# Patient Record
Sex: Male | Born: 1955
Health system: Southern US, Community
[De-identification: ages and names within clinical notes are randomized; demographics above are authoritative.]

## PROBLEM LIST (undated history)

## (undated) DIAGNOSIS — Z720 Tobacco use: Secondary | ICD-10-CM

## (undated) DIAGNOSIS — I10 Essential (primary) hypertension: Secondary | ICD-10-CM

---

## 2004-07-10 ENCOUNTER — Emergency Department (HOSPITAL_COMMUNITY): Admission: EM | Admit: 2004-07-10 | Discharge: 2004-07-11 | Payer: Self-pay | Admitting: Emergency Medicine

## 2006-03-12 ENCOUNTER — Emergency Department (HOSPITAL_COMMUNITY): Admission: EM | Admit: 2006-03-12 | Discharge: 2006-03-12 | Payer: Self-pay | Admitting: Emergency Medicine

## 2006-03-19 ENCOUNTER — Ambulatory Visit: Payer: Self-pay | Admitting: Family Medicine

## 2006-04-24 ENCOUNTER — Ambulatory Visit: Payer: Self-pay | Admitting: Family Medicine

## 2007-07-04 ENCOUNTER — Emergency Department (HOSPITAL_COMMUNITY): Admission: EM | Admit: 2007-07-04 | Discharge: 2007-07-04 | Payer: Self-pay | Admitting: Emergency Medicine

## 2011-07-05 LAB — DIFFERENTIAL
Basophils Absolute: 0
Basophils Relative: 1
Eosinophils Absolute: 0.1
Eosinophils Relative: 1
Lymphocytes Relative: 44
Lymphs Abs: 3.4 — ABNORMAL HIGH
Monocytes Absolute: 0.5
Monocytes Relative: 6
Neutro Abs: 3.6
Neutrophils Relative %: 48

## 2011-07-05 LAB — POCT CARDIAC MARKERS
CKMB, poc: <1 — ABNORMAL LOW
Myoglobin, poc: 55.2
Operator id: 4295
Troponin i, poc: <0.05

## 2011-07-05 LAB — CBC
HCT: 47.2
Hemoglobin: 16.7
MCHC: 35.3
MCV: 88.3
Platelets: 317
RBC: 5.34
RDW: 14
WBC: 7.6

## 2011-07-05 LAB — BASIC METABOLIC PANEL
BUN: 8
CO2: 24
Calcium: 9.7
Chloride: 106
Creatinine, Ser: 0.75
GFR calc Af Amer: >60
GFR calc non Af Amer: >60
Glucose, Bld: 107 — ABNORMAL HIGH
Potassium: 4.1
Sodium: 137

## 2011-09-10 ENCOUNTER — Emergency Department (HOSPITAL_COMMUNITY): Payer: Worker's Compensation

## 2011-09-10 ENCOUNTER — Encounter (HOSPITAL_COMMUNITY): Admission: EM | Disposition: A | Discharge status: Skilled Nursing Facility | Payer: Self-pay | Source: Ambulatory Visit

## 2011-09-10 ENCOUNTER — Inpatient Hospital Stay (HOSPITAL_COMMUNITY)
Admission: EM | Admit: 2011-09-10 | Discharge: 2011-09-27 | DRG: 493 | Disposition: A | Discharge status: Skilled Nursing Facility | Payer: Worker's Compensation | Source: Ambulatory Visit | Attending: General Surgery | Admitting: General Surgery

## 2011-09-10 ENCOUNTER — Other Ambulatory Visit: Payer: Self-pay

## 2011-09-10 ENCOUNTER — Emergency Department (HOSPITAL_COMMUNITY): Payer: Worker's Compensation | Admitting: Anesthesiology

## 2011-09-10 ENCOUNTER — Encounter: Payer: Self-pay | Admitting: Emergency Medicine

## 2011-09-10 ENCOUNTER — Encounter (HOSPITAL_COMMUNITY): Payer: Self-pay | Admitting: Anesthesiology

## 2011-09-10 DIAGNOSIS — IMO0001 Reserved for inherently not codable concepts without codable children: Secondary | ICD-10-CM | POA: Diagnosis present

## 2011-09-10 DIAGNOSIS — M6200 Separation of muscle (nontraumatic), unspecified site: Secondary | ICD-10-CM | POA: Diagnosis present

## 2011-09-10 DIAGNOSIS — S71009A Unspecified open wound, unspecified hip, initial encounter: Secondary | ICD-10-CM | POA: Diagnosis present

## 2011-09-10 DIAGNOSIS — L89109 Pressure ulcer of unspecified part of back, unspecified stage: Secondary | ICD-10-CM | POA: Diagnosis not present

## 2011-09-10 DIAGNOSIS — S81809A Unspecified open wound, unspecified lower leg, initial encounter: Secondary | ICD-10-CM

## 2011-09-10 DIAGNOSIS — S82899A Other fracture of unspecified lower leg, initial encounter for closed fracture: Principal | ICD-10-CM | POA: Diagnosis present

## 2011-09-10 DIAGNOSIS — Z79899 Other long term (current) drug therapy: Secondary | ICD-10-CM

## 2011-09-10 DIAGNOSIS — IMO0002 Reserved for concepts with insufficient information to code with codable children: Secondary | ICD-10-CM | POA: Diagnosis present

## 2011-09-10 DIAGNOSIS — S71109A Unspecified open wound, unspecified thigh, initial encounter: Secondary | ICD-10-CM | POA: Diagnosis present

## 2011-09-10 DIAGNOSIS — A0472 Enterocolitis due to Clostridium difficile, not specified as recurrent: Secondary | ICD-10-CM | POA: Diagnosis not present

## 2011-09-10 DIAGNOSIS — S92309A Fracture of unspecified metatarsal bone(s), unspecified foot, initial encounter for closed fracture: Secondary | ICD-10-CM | POA: Diagnosis present

## 2011-09-10 DIAGNOSIS — S43006A Unspecified dislocation of unspecified shoulder joint, initial encounter: Secondary | ICD-10-CM

## 2011-09-10 DIAGNOSIS — S32309A Unspecified fracture of unspecified ilium, initial encounter for closed fracture: Secondary | ICD-10-CM | POA: Diagnosis present

## 2011-09-10 DIAGNOSIS — S91009A Unspecified open wound, unspecified ankle, initial encounter: Secondary | ICD-10-CM

## 2011-09-10 DIAGNOSIS — S42253A Displaced fracture of greater tuberosity of unspecified humerus, initial encounter for closed fracture: Secondary | ICD-10-CM | POA: Diagnosis present

## 2011-09-10 DIAGNOSIS — S82891A Other fracture of right lower leg, initial encounter for closed fracture: Secondary | ICD-10-CM

## 2011-09-10 DIAGNOSIS — K560 Paralytic ileus: Secondary | ICD-10-CM | POA: Diagnosis not present

## 2011-09-10 DIAGNOSIS — N4889 Other specified disorders of penis: Secondary | ICD-10-CM | POA: Diagnosis not present

## 2011-09-10 DIAGNOSIS — Z7901 Long term (current) use of anticoagulants: Secondary | ICD-10-CM

## 2011-09-10 DIAGNOSIS — I959 Hypotension, unspecified: Secondary | ICD-10-CM | POA: Diagnosis present

## 2011-09-10 DIAGNOSIS — S9304XA Dislocation of right ankle joint, initial encounter: Secondary | ICD-10-CM

## 2011-09-10 DIAGNOSIS — Z7982 Long term (current) use of aspirin: Secondary | ICD-10-CM

## 2011-09-10 DIAGNOSIS — D62 Acute posthemorrhagic anemia: Secondary | ICD-10-CM | POA: Diagnosis not present

## 2011-09-10 DIAGNOSIS — S43004A Unspecified dislocation of right shoulder joint, initial encounter: Secondary | ICD-10-CM

## 2011-09-10 DIAGNOSIS — S81009A Unspecified open wound, unspecified knee, initial encounter: Secondary | ICD-10-CM

## 2011-09-10 DIAGNOSIS — F17200 Nicotine dependence, unspecified, uncomplicated: Secondary | ICD-10-CM | POA: Diagnosis present

## 2011-09-10 DIAGNOSIS — I10 Essential (primary) hypertension: Secondary | ICD-10-CM | POA: Diagnosis present

## 2011-09-10 DIAGNOSIS — Y9229 Other specified public building as the place of occurrence of the external cause: Secondary | ICD-10-CM

## 2011-09-10 DIAGNOSIS — S329XXA Fracture of unspecified parts of lumbosacral spine and pelvis, initial encounter for closed fracture: Secondary | ICD-10-CM

## 2011-09-10 DIAGNOSIS — L8992 Pressure ulcer of unspecified site, stage 2: Secondary | ICD-10-CM | POA: Diagnosis not present

## 2011-09-10 DIAGNOSIS — S81819A Laceration without foreign body, unspecified lower leg, initial encounter: Secondary | ICD-10-CM

## 2011-09-10 DIAGNOSIS — R000 Tachycardia, unspecified: Secondary | ICD-10-CM | POA: Diagnosis present

## 2011-09-10 DIAGNOSIS — S3210XA Unspecified fracture of sacrum, initial encounter for closed fracture: Secondary | ICD-10-CM | POA: Diagnosis present

## 2011-09-10 DIAGNOSIS — S82009B Unspecified fracture of unspecified patella, initial encounter for open fracture type I or II: Secondary | ICD-10-CM | POA: Diagnosis present

## 2011-09-10 DIAGNOSIS — S42301A Unspecified fracture of shaft of humerus, right arm, initial encounter for closed fracture: Secondary | ICD-10-CM

## 2011-09-10 DIAGNOSIS — S32509A Unspecified fracture of unspecified pubis, initial encounter for closed fracture: Secondary | ICD-10-CM | POA: Diagnosis present

## 2011-09-10 HISTORY — DX: Essential (primary) hypertension: I10

## 2011-09-10 HISTORY — DX: Tobacco use: Z72.0

## 2011-09-10 HISTORY — PX: EXTERNAL FIXATION PELVIS: SHX1551

## 2011-09-10 HISTORY — PX: IRRIGATION AND DEBRIDEMENT KNEE: SHX5185

## 2011-09-10 LAB — URINALYSIS, MICROSCOPIC ONLY
Glucose, UA: NEGATIVE mg/dL
Ketones, ur: 15 mg/dL — AB
Nitrite: POSITIVE — AB
Protein, ur: 100 mg/dL — AB
Specific Gravity, Urine: 1.025 (ref 1.005–1.030)
Urobilinogen, UA: 1 mg/dL (ref 0.0–1.0)
pH: 5.5 (ref 5.0–8.0)

## 2011-09-10 LAB — COMPREHENSIVE METABOLIC PANEL
ALT: 20 U/L (ref 0–53)
AST: 38 U/L — ABNORMAL HIGH (ref 0–37)
Albumin: 2.7 g/dL — ABNORMAL LOW (ref 3.5–5.2)
Alkaline Phosphatase: 53 U/L (ref 39–117)
BUN: 19 mg/dL (ref 6–23)
CO2: 18 mEq/L — ABNORMAL LOW (ref 19–32)
Calcium: 6.4 mg/dL — CL (ref 8.4–10.5)
Chloride: 107 mEq/L (ref 96–112)
Creatinine, Ser: 1.16 mg/dL (ref 0.50–1.35)
GFR calc Af Amer: 80 mL/min — ABNORMAL LOW (ref >90)
GFR calc non Af Amer: 69 mL/min — ABNORMAL LOW (ref >90)
Glucose, Bld: 167 mg/dL — ABNORMAL HIGH (ref 70–99)
Potassium: 3.4 mEq/L — ABNORMAL LOW (ref 3.5–5.1)
Sodium: 137 mEq/L (ref 135–145)
Total Bilirubin: 0.3 mg/dL (ref 0.3–1.2)
Total Protein: 5.3 g/dL — ABNORMAL LOW (ref 6.0–8.3)

## 2011-09-10 LAB — CBC
HCT: 40.8 % (ref 39.0–52.0)
Hemoglobin: 14.3 g/dL (ref 13.0–17.0)
MCH: 31 pg (ref 26.0–34.0)
MCHC: 35 g/dL (ref 30.0–36.0)
MCHC: 35 g/dL (ref 30.0–36.0)
MCV: 88.3 fL (ref 78.0–100.0)
MCV: 87.3 fL (ref 78.0–100.0)
Platelets: 316 10*3/uL (ref 150–400)
Platelets: 222 10*3/uL (ref 150–400)
RBC: 4.62 MIL/uL (ref 4.22–5.81)
RDW: 14.1 % (ref 11.5–15.5)
RDW: 14 % (ref 11.5–15.5)
WBC: 25.1 10*3/uL — ABNORMAL HIGH (ref 4.0–10.5)
WBC: 13.5 10*3/uL — ABNORMAL HIGH (ref 4.0–10.5)

## 2011-09-10 LAB — PROTIME-INR: Prothrombin Time: 13.5 seconds (ref 11.6–15.2)

## 2011-09-10 LAB — LACTIC ACID, PLASMA: Lactic Acid, Venous: 5.9 mmol/L — ABNORMAL HIGH (ref 0.5–2.2)

## 2011-09-10 SURGERY — EXTERNAL FIXATION, PELVIS
Anesthesia: General | Site: Pelvis | Wound class: Clean

## 2011-09-10 MED ORDER — SODIUM CHLORIDE 0.9 % IV SOLN
INTRAVENOUS | Status: DC | PRN
  Administered 2011-09-10: via INTRAVENOUS

## 2011-09-10 MED ORDER — HYDROMORPHONE HCL PF 1 MG/ML IJ SOLN: 2.0000 mg | INTRAMUSCULAR | Status: DC | PRN

## 2011-09-10 MED ORDER — MORPHINE SULFATE 2 MG/ML IJ SOLN
INTRAMUSCULAR | Status: AC | PRN
  Administered 2011-09-10: 4 mg via INTRAVENOUS

## 2011-09-10 MED ORDER — CEFAZOLIN SODIUM-DEXTROSE 2-3 GM-% IV SOLR: 2.0000 g | Freq: Three times a day (TID) | INTRAVENOUS | Status: DC

## 2011-09-10 MED ORDER — CEFAZOLIN SODIUM 1 G IJ SOLR
INTRAMUSCULAR | Status: AC
  Filled 2011-09-10: qty 20

## 2011-09-10 MED ORDER — MORPHINE SULFATE 4 MG/ML IJ SOLN
INTRAMUSCULAR | Status: AC
  Administered 2011-09-10: 4 mg via INTRAVENOUS
  Filled 2011-09-10: qty 1

## 2011-09-10 MED ORDER — KCL IN DEXTROSE-NACL 20-5-0.9 MEQ/L-%-% IV SOLN
INTRAVENOUS | Status: DC
  Administered 2011-09-11 – 2011-09-23 (×15): via INTRAVENOUS
  Filled 2011-09-10 (×29): qty 1000

## 2011-09-10 MED ORDER — TETANUS-DIPHTH-ACELL PERTUSSIS 5-2.5-18.5 LF-MCG/0.5 IM SUSP
INTRAMUSCULAR | Status: AC
  Administered 2011-09-10: 0.5 mL via INTRAMUSCULAR
  Filled 2011-09-10: qty 0.5

## 2011-09-10 MED ORDER — PANTOPRAZOLE SODIUM 40 MG IV SOLR
40.0000 mg | Freq: Every day | INTRAVENOUS | Status: DC
  Administered 2011-09-11 – 2011-09-14 (×4): 40 mg via INTRAVENOUS
  Filled 2011-09-10 (×5): qty 40

## 2011-09-10 MED ORDER — CEFAZOLIN SODIUM 1-5 GM-% IV SOLN
INTRAVENOUS | Status: AC
  Administered 2011-09-10: 2 g via INTRAVENOUS
  Filled 2011-09-10: qty 100

## 2011-09-10 MED ORDER — IOHEXOL 300 MG/ML  SOLN
100.0000 mL | Freq: Once | INTRAMUSCULAR | Status: AC | PRN
  Administered 2011-09-10: 100 mL via INTRAVENOUS

## 2011-09-10 MED ORDER — ONDANSETRON HCL 4 MG/2ML IJ SOLN
4.0000 mg | Freq: Four times a day (QID) | INTRAMUSCULAR | Status: DC | PRN
  Administered 2011-09-16: 4 mg via INTRAVENOUS
  Filled 2011-09-10: qty 2

## 2011-09-10 SURGICAL SUPPLY — 43 items
350MM CARBON ROD ×2 IMPLANT
6MM SELF DRILLING BONE SCREW 200MM ×2 IMPLANT
8MM DRILL BIT ×1 IMPLANT
BAR TO BAR CLAMP ×1 IMPLANT
BLADE SURG 15 STRL LF DISP TIS (BLADE) ×2 IMPLANT
BLADE SURG 15 STRL SS (BLADE) ×3
BRUSH SCRUB DISP (MISCELLANEOUS) ×6 IMPLANT
CLOTH BEACON ORANGE TIMEOUT ST (SAFETY) ×3 IMPLANT
COVER SURGICAL LIGHT HANDLE (MISCELLANEOUS) ×7 IMPLANT
DRAPE C-ARM 42X72 X-RAY (DRAPES) ×1 IMPLANT
DRAPE C-ARMOR (DRAPES) ×3 IMPLANT
DRAPE INCISE IOBAN 66X45 STRL (DRAPES) ×3 IMPLANT
DRAPE LAPAROTOMY TRNSV 102X78 (DRAPE) ×3 IMPLANT
DRAPE U-SHAPE 47X51 STRL (DRAPES) ×3 IMPLANT
DRSG PAD ABDOMINAL 8X10 ST (GAUZE/BANDAGES/DRESSINGS) ×6 IMPLANT
ELECT REM PT RETURN 9FT ADLT (ELECTROSURGICAL) ×3
ELECTRODE REM PT RTRN 9FT ADLT (ELECTROSURGICAL) ×2 IMPLANT
GAUZE XEROFORM 5X9 LF (GAUZE/BANDAGES/DRESSINGS) ×3 IMPLANT
GLOVE BIO SURGEON STRL SZ7.5 (GLOVE) ×3 IMPLANT
GLOVE BIO SURGEON STRL SZ8 (GLOVE) ×5 IMPLANT
GLOVE BIOGEL PI IND STRL 7.5 (GLOVE) ×2 IMPLANT
GLOVE BIOGEL PI IND STRL 8 (GLOVE) ×2 IMPLANT
GLOVE BIOGEL PI INDICATOR 7.5 (GLOVE) ×1
GLOVE BIOGEL PI INDICATOR 8 (GLOVE) ×1
GOWN PREVENTION PLUS XLARGE (GOWN DISPOSABLE) ×3 IMPLANT
GOWN STRL NON-REIN LRG LVL3 (GOWN DISPOSABLE) ×6 IMPLANT
KIT BASIN OR (CUSTOM PROCEDURE TRAY) ×4 IMPLANT
KIT ROOM TURNOVER OR (KITS) ×3 IMPLANT
MANIFOLD NEPTUNE II (INSTRUMENTS) ×3 IMPLANT
NS IRRIG 1000ML POUR BTL (IV SOLUTION) ×4 IMPLANT
PACK GENERAL/GYN (CUSTOM PROCEDURE TRAY) ×3 IMPLANT
PACK ORTHO EXTREMITY (CUSTOM PROCEDURE TRAY) ×1 IMPLANT
PAD ARMBOARD 7.5X6 YLW CONV (MISCELLANEOUS) ×6 IMPLANT
PILLOW ABDUCTION HIP (SOFTGOODS) IMPLANT
PIN TO BAR CLAMP ×2 IMPLANT
SPONGE GAUZE 4X4 12PLY (GAUZE/BANDAGES/DRESSINGS) ×4 IMPLANT
STAPLER VISISTAT 35W (STAPLE) ×3 IMPLANT
SUT ETHILON 3 0 PS 1 (SUTURE) ×6 IMPLANT
SUT VIC AB 2-0 FS1 27 (SUTURE) ×3 IMPLANT
TOWEL OR 17X24 6PK STRL BLUE (TOWEL DISPOSABLE) ×3 IMPLANT
TOWEL OR 17X26 10 PK STRL BLUE (TOWEL DISPOSABLE) ×6 IMPLANT
UNDERPAD 30X30 INCONTINENT (UNDERPADS AND DIAPERS) ×4 IMPLANT
WATER STERILE IRR 1000ML POUR (IV SOLUTION) ×3 IMPLANT

## 2011-09-10 NOTE — ED Notes (Signed)
Pelvic binder placed by Dr Rennis Chris.
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
## 2011-09-10 NOTE — ED Notes (Signed)
Dr Magnus Ivan notified of BP.  New orders for another liter of saline wide open and two more units of blood.

## 2011-09-10 NOTE — Anesthesia Preprocedure Evaluation (Addendum)
Anesthesia Evaluation  Patient identified by MRN, date of birth, ID band Patient awake    Airway Mallampati: II      Dental  (+) Dental Advisory Given   Pulmonary neg pulmonary ROS,  clear to auscultation        Cardiovascular hypertension, Pt. on medications Regular Normal    Neuro/Psych    GI/Hepatic negative GI ROS, Neg liver ROS,   Endo/Other    Renal/GU negative Renal ROS     Musculoskeletal   Abdominal   Peds  Hematology   Anesthesia Other Findings   Reproductive/Obstetrics                           Anesthesia Physical Anesthesia Plan  ASA: III  Anesthesia Plan: General   Post-op Pain Management:    Induction: Rapid sequence and Cricoid pressure planned  Airway Management Planned: Oral ETT  Additional Equipment:   Intra-op Plan:   Post-operative Plan: Possible Post-op intubation/ventilation  Informed Consent: I have reviewed the patients History and Physical, chart, labs and discussed the procedure including the risks, benefits and alternatives for the proposed anesthesia with the patient or authorized representative who has indicated his/her understanding and acceptance.   Dental advisory given  Plan Discussed with: CRNA  Anesthesia Plan Comments:         Anesthesia Quick Evaluation
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
## 2011-09-10 NOTE — ED Notes (Signed)
Right shoulder manually reduced by Dr Rennis Chris.

## 2011-09-10 NOTE — ED Notes (Signed)
Dr Rennis Chris manually reduced right ankle.

## 2011-09-10 NOTE — ED Notes (Signed)
Paged Ortho  
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
## 2011-09-11 ENCOUNTER — Inpatient Hospital Stay (HOSPITAL_COMMUNITY): Payer: Worker's Compensation

## 2011-09-11 ENCOUNTER — Emergency Department (HOSPITAL_COMMUNITY): Payer: Worker's Compensation

## 2011-09-11 ENCOUNTER — Encounter (HOSPITAL_COMMUNITY): Payer: Self-pay | Admitting: Orthopedic Surgery

## 2011-09-11 LAB — CBC
HCT: 30.3 % — ABNORMAL LOW (ref 39.0–52.0)
HCT: 30.4 % — ABNORMAL LOW (ref 39.0–52.0)
Hemoglobin: 10.9 g/dL — ABNORMAL LOW (ref 13.0–17.0)
Hemoglobin: 10.7 g/dL — ABNORMAL LOW (ref 13.0–17.0)
MCH: 30.5 pg (ref 26.0–34.0)
MCH: 29.9 pg (ref 26.0–34.0)
MCHC: 36 g/dL (ref 30.0–36.0)
MCHC: 35.2 g/dL (ref 30.0–36.0)
MCV: 84.9 fL (ref 78.0–100.0)
MCV: 84.9 fL (ref 78.0–100.0)
Platelets: 142 K/uL — ABNORMAL LOW (ref 150–400)
RBC: 3.57 MIL/uL — ABNORMAL LOW (ref 4.22–5.81)
RBC: 3.58 MIL/uL — ABNORMAL LOW (ref 4.22–5.81)
RDW: 14.3 % (ref 11.5–15.5)
WBC: 8 K/uL (ref 4.0–10.5)

## 2011-09-11 LAB — BASIC METABOLIC PANEL WITH GFR
BUN: 16 mg/dL (ref 6–23)
CO2: 21 meq/L (ref 19–32)
Calcium: 6.6 mg/dL — ABNORMAL LOW (ref 8.4–10.5)
Chloride: 111 meq/L (ref 96–112)
Creatinine, Ser: 0.82 mg/dL (ref 0.50–1.35)
GFR calc Af Amer: >90 mL/min (ref >90)
GFR calc non Af Amer: >90 mL/min (ref >90)
Glucose, Bld: 148 mg/dL — ABNORMAL HIGH (ref 70–99)
Potassium: 3.8 meq/L (ref 3.5–5.1)
Sodium: 139 meq/L (ref 135–145)

## 2011-09-11 MED ORDER — ONDANSETRON HCL 4 MG/2ML IJ SOLN: 4.0000 mg | Freq: Four times a day (QID) | INTRAMUSCULAR | Status: DC | PRN

## 2011-09-11 MED ORDER — SUCCINYLCHOLINE CHLORIDE 20 MG/ML IJ SOLN
INTRAMUSCULAR | Status: DC | PRN
  Administered 2011-09-11: 100 mg via INTRAVENOUS

## 2011-09-11 MED ORDER — MEPERIDINE HCL 25 MG/ML IJ SOLN: 6.2500 mg | INTRAMUSCULAR | Status: DC | PRN

## 2011-09-11 MED ORDER — 0.9 % SODIUM CHLORIDE (POUR BTL) OPTIME
TOPICAL | Status: DC | PRN
  Administered 2011-09-11: 2000 mL

## 2011-09-11 MED ORDER — PROMETHAZINE HCL 25 MG/ML IJ SOLN: 6.2500 mg | INTRAMUSCULAR | Status: DC | PRN

## 2011-09-11 MED ORDER — HYDROMORPHONE HCL PF 1 MG/ML IJ SOLN: 0.2500 mg | INTRAMUSCULAR | Status: DC | PRN

## 2011-09-11 MED ORDER — DIPHENHYDRAMINE HCL 50 MG/ML IJ SOLN: 12.5000 mg | Freq: Four times a day (QID) | INTRAMUSCULAR | Status: DC | PRN

## 2011-09-11 MED ORDER — LACTATED RINGERS IV SOLN
INTRAVENOUS | Status: DC | PRN
  Administered 2011-09-11 (×2): via INTRAVENOUS

## 2011-09-11 MED ORDER — SODIUM CHLORIDE 0.9 % IV SOLN
Freq: Once | INTRAVENOUS | Status: AC
  Administered 2011-09-11: 05:00:00 via INTRAVENOUS

## 2011-09-11 MED ORDER — SODIUM CHLORIDE 0.9 % IV SOLN
INTRAVENOUS | Status: DC | PRN
  Administered 2011-09-10: via INTRAVENOUS

## 2011-09-11 MED ORDER — CEFAZOLIN SODIUM-DEXTROSE 2-3 GM-% IV SOLR
2.0000 g | Freq: Three times a day (TID) | INTRAVENOUS | Status: AC
  Administered 2011-09-11 – 2011-09-12 (×5): 2 g via INTRAVENOUS
  Filled 2011-09-11 (×5): qty 50

## 2011-09-11 MED ORDER — NALOXONE HCL 0.4 MG/ML IJ SOLN: 0.4000 mg | INTRAMUSCULAR | Status: DC | PRN

## 2011-09-11 MED ORDER — SODIUM CHLORIDE 0.9 % IJ SOLN: 9.0000 mL | INTRAMUSCULAR | Status: DC | PRN

## 2011-09-11 MED ORDER — ETOMIDATE 2 MG/ML IV SOLN
INTRAVENOUS | Status: DC | PRN
  Administered 2011-09-11: 13 mg via INTRAVENOUS

## 2011-09-11 MED ORDER — DIPHENHYDRAMINE HCL 12.5 MG/5ML PO ELIX
12.5000 mg | ORAL_SOLUTION | Freq: Four times a day (QID) | ORAL | Status: DC | PRN
  Filled 2011-09-11: qty 5

## 2011-09-11 MED ORDER — FENTANYL CITRATE 0.05 MG/ML IJ SOLN
INTRAMUSCULAR | Status: DC | PRN
  Administered 2011-09-11 (×2): 50 ug via INTRAVENOUS
  Administered 2011-09-11: 100 ug via INTRAVENOUS
  Administered 2011-09-11: 50 ug via INTRAVENOUS

## 2011-09-11 MED ORDER — MIDAZOLAM HCL 2 MG/2ML IJ SOLN: 0.5000 mg | Freq: Once | INTRAMUSCULAR | Status: DC | PRN

## 2011-09-11 MED ORDER — MORPHINE SULFATE 2 MG/ML IJ SOLN: 0.0500 mg/kg | INTRAMUSCULAR | Status: DC | PRN

## 2011-09-11 MED ORDER — HYDROMORPHONE 0.3 MG/ML IV SOLN
INTRAVENOUS | Status: DC
  Administered 2011-09-11: 2.7 mg via INTRAVENOUS
  Administered 2011-09-11: 1.5 mg via INTRAVENOUS
  Administered 2011-09-11: 2.09 mg via INTRAVENOUS
  Administered 2011-09-11 (×2): via INTRAVENOUS
  Administered 2011-09-11: 2.4 mg via INTRAVENOUS
  Administered 2011-09-12: 1.5 mg via INTRAVENOUS
  Administered 2011-09-12: 1.8 mg via INTRAVENOUS
  Administered 2011-09-12: 1.5 mg via INTRAVENOUS
  Administered 2011-09-12: 0.6 mg via INTRAVENOUS
  Administered 2011-09-12: 0.9 mg via INTRAVENOUS
  Administered 2011-09-12: 2.1 mg via INTRAVENOUS
  Administered 2011-09-13: 21:00:00 via INTRAVENOUS
  Administered 2011-09-13: 0.6 mg via INTRAVENOUS
  Administered 2011-09-13: 0.9 mg via INTRAVENOUS
  Administered 2011-09-13: 1.5 mg via INTRAVENOUS
  Administered 2011-09-13: 2.1 mg via INTRAVENOUS
  Administered 2011-09-13: 09:00:00 via INTRAVENOUS
  Administered 2011-09-13: 2.4 mg via INTRAVENOUS
  Administered 2011-09-13: 3.3 mg via INTRAVENOUS
  Administered 2011-09-13: 1.5 mg via INTRAVENOUS
  Administered 2011-09-14: 4.2 mg via INTRAVENOUS
  Administered 2011-09-14: 3.3 mg via INTRAVENOUS
  Administered 2011-09-14: 16:00:00 via INTRAVENOUS
  Administered 2011-09-15: 1.5 mg via INTRAVENOUS
  Administered 2011-09-15 (×2): via INTRAVENOUS
  Administered 2011-09-15: 1.92 mg via INTRAVENOUS
  Administered 2011-09-15: 2.5 mg via INTRAVENOUS
  Administered 2011-09-15: 1.2 mg via INTRAVENOUS
  Administered 2011-09-15: 2.1 mg via INTRAVENOUS
  Administered 2011-09-15: 3.3 mg via INTRAVENOUS
  Administered 2011-09-16: 1.5 mg via INTRAVENOUS
  Administered 2011-09-16: 1.2 mg via INTRAVENOUS
  Administered 2011-09-16: 4.5 mg via INTRAVENOUS
  Administered 2011-09-16: 2.9 mg via INTRAVENOUS
  Administered 2011-09-16: 10:00:00 via INTRAVENOUS
  Administered 2011-09-16: 2.87 mg via INTRAVENOUS
  Administered 2011-09-16: 1.2 mg via INTRAVENOUS
  Administered 2011-09-17: 3.6 mg via INTRAVENOUS
  Administered 2011-09-17: 1.2 mg via INTRAVENOUS
  Administered 2011-09-17: 0.9 mg via INTRAVENOUS
  Administered 2011-09-17: 21:00:00 via INTRAVENOUS
  Administered 2011-09-17: 2.4 mg via INTRAVENOUS
  Administered 2011-09-17: 0.9 mg via INTRAVENOUS
  Administered 2011-09-18: 2.1 mg via INTRAVENOUS
  Administered 2011-09-18: 3.66 mg via INTRAVENOUS
  Administered 2011-09-18: 1.26 mg via INTRAVENOUS
  Administered 2011-09-18: 1.92 mg via INTRAVENOUS
  Administered 2011-09-18: 23:00:00 via INTRAVENOUS
  Administered 2011-09-18: 3.77 mg via INTRAVENOUS
  Administered 2011-09-18: 1.2 mg via INTRAVENOUS
  Administered 2011-09-19: 3.9 mg via INTRAVENOUS
  Administered 2011-09-19: 1.2 mg via INTRAVENOUS
  Administered 2011-09-19: 3.6 mg via INTRAVENOUS
  Administered 2011-09-19: 2.88 mg via INTRAVENOUS
  Administered 2011-09-19: 12:00:00 via INTRAVENOUS
  Administered 2011-09-19 (×2): 2.1 mg via INTRAVENOUS
  Administered 2011-09-20: 3.36 mg via INTRAVENOUS
  Administered 2011-09-20: 01:00:00 via INTRAVENOUS
  Administered 2011-09-20: 2.1 mg via INTRAVENOUS
  Administered 2011-09-20 – 2011-09-21 (×2): via INTRAVENOUS
  Administered 2011-09-21: 5.1 mg via INTRAVENOUS
  Administered 2011-09-21: 2.1 mg via INTRAVENOUS
  Administered 2011-09-21: 3 mg via INTRAVENOUS
  Administered 2011-09-21: 2.4 mg via INTRAVENOUS
  Administered 2011-09-21: 2.51 mg via INTRAVENOUS
  Administered 2011-09-21: 19:00:00 via INTRAVENOUS
  Administered 2011-09-22: 2.53 mg via INTRAVENOUS
  Filled 2011-09-11 (×22): qty 25
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
## 2011-09-11 NOTE — Anesthesia Procedure Notes (Signed)
Date/Time: 09/11/2011 12:00 AM Performed by: Molli Hazard Pre-anesthesia Checklist: Patient identified, Emergency Drugs available, Suction available and Patient being monitored Patient Re-evaluated:Patient Re-evaluated prior to inductionOxygen Delivery Method: Circle System Utilized Preoxygenation: Pre-oxygenation with 100% oxygen Intubation Type: IV induction, Rapid sequence and Circoid Pressure applied Laryngoscope Size: 2 and Miller Grade View: Grade I Tube type: Oral Tube size: 7.5 mm Number of attempts: 1 Airway Equipment and Method: stylet Placement Confirmation: ETT inserted through vocal cords under direct vision,  positive ETCO2 and breath sounds checked- equal and bilateral Secured at: 23 cm Tube secured with: Tape Dental Injury: Teeth and Oropharynx as per pre-operative assessment
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
## 2011-09-11 NOTE — Anesthesia Postprocedure Evaluation (Signed)
  Anesthesia Post-op Note  Patient: Theodore Gonzalez  Procedure(s) Performed:  EXTERNAL FIXATION PELVIS; IRRIGATION AND DEBRIDEMENT KNEE  Patient Location: PACU  Anesthesia Type: General  Level of Consciousness: awake  Airway and Oxygen Therapy: Patient Spontanous Breathing  Post-op Pain: mild  Post-op Assessment: Post-op Vital signs reviewed  Post-op Vital Signs: stable  Complications: No apparent anesthesia complications

## 2011-09-11 NOTE — Transfer of Care (Signed)
Immediate Anesthesia Transfer of Care Note  Patient: Theodore Gonzalez  Procedure(s) Performed:  EXTERNAL FIXATION PELVIS; IRRIGATION AND DEBRIDEMENT KNEE  Patient Location: PACU  Anesthesia Type: General  Level of Consciousness: sedated  Airway & Oxygen Therapy: Patient connected to nasal cannula oxygen  Post-op Assessment: Report given to PACU RN and Post -op Vital signs reviewed and stable  Post vital signs: Reviewed and stable  Complications: No apparent anesthesia complications

## 2011-09-12 ENCOUNTER — Encounter (HOSPITAL_COMMUNITY): Payer: Self-pay | Admitting: Anesthesiology

## 2011-09-12 ENCOUNTER — Inpatient Hospital Stay (HOSPITAL_COMMUNITY): Payer: Worker's Compensation

## 2011-09-12 LAB — CBC
Hemoglobin: 9.2 g/dL — ABNORMAL LOW (ref 13.0–17.0)
MCH: 30.4 pg (ref 26.0–34.0)
MCH: 29.8 pg (ref 26.0–34.0)
MCHC: 35.2 g/dL (ref 30.0–36.0)
Platelets: 123 10*3/uL — ABNORMAL LOW (ref 150–400)
Platelets: 142 10*3/uL — ABNORMAL LOW (ref 150–400)
RBC: 3.09 MIL/uL — ABNORMAL LOW (ref 4.22–5.81)
RBC: 3.09 MIL/uL — ABNORMAL LOW (ref 4.22–5.81)
WBC: 7.6 10*3/uL (ref 4.0–10.5)

## 2011-09-12 LAB — BASIC METABOLIC PANEL
Calcium: 7.2 mg/dL — ABNORMAL LOW (ref 8.4–10.5)
GFR calc Af Amer: >90 mL/min (ref >90)
GFR calc non Af Amer: >90 mL/min (ref >90)
Sodium: 140 mEq/L (ref 135–145)

## 2011-09-12 MED ORDER — CEFAZOLIN SODIUM-DEXTROSE 2-3 GM-% IV SOLR
2.0000 g | Freq: Once | INTRAVENOUS | Status: AC
  Administered 2011-09-14: 2 g via INTRAVENOUS
  Filled 2011-09-12: qty 50
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
## 2011-09-12 NOTE — Progress Notes (Signed)
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 1-60 Min  09/11/2011  CLINICAL DATA: right ankle splinting   C-ARM 1-60 MINUTES  Fluoroscopy was utilized by the requesting physician.  No radiographic  interpretation.      Anti-infectives: Anti-infectives     Start     Dose/Rate Route Frequency Ordered Stop   09/11/11 0600   ceFAZolin (ANCEF) IVPB 2 g/50 mL premix        2 g 100 mL/hr over 30 Minutes Intravenous 3 times per day 09/11/11 0233 09/12/11 2159   09/10/11 2200   ceFAZolin (ANCEF) IVPB 2 g/50 mL premix  Status:  Discontinued        2 g 100 mL/hr over 30 Minutes Intravenous 3 times per day 09/10/11 2159 09/11/11 0233   09/10/11 2049   ceFAZolin (ANCEF) 1-5 GM-% IVPB     Comments: FORREST, ZACH: cabinet override         09/10/11 2049 09/10/11 2113   09/10/11 2046   ceFAZolin (ANCEF) 1 G injection  Status:  Discontinued     Comments: FORREST, ZACH: cabinet override         09/10/11 2046 09/10/11 2050          Assessment/Plan: s/p Procedure(s): EXTERNAL FIXATION PELVIS IRRIGATION AND DEBRIDEMENT KNEE Advance diet Continue foley due to Continue foley due to  patient in ICU and Continue foley due to Urinary output monitoring Not sure what additional orthopedic surgery will be necessary, but  need to  plan for PT/OT  LOS: 2 days   Marta Lamas. Gae Bon, MD, FACS [phone] Trauma Surgeon 09/12/2011
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
## 2011-09-13 ENCOUNTER — Encounter (HOSPITAL_COMMUNITY): Admission: EM | Disposition: A | Discharge status: Skilled Nursing Facility | Payer: Self-pay | Source: Ambulatory Visit

## 2011-09-13 LAB — DIFFERENTIAL
Eosinophils Relative: 1 % (ref 0–5)
Lymphocytes Relative: 18 % (ref 12–46)
Lymphs Abs: 1.4 10*3/uL (ref 0.7–4.0)
Monocytes Absolute: 0.8 10*3/uL (ref 0.1–1.0)
Monocytes Relative: 10 % (ref 3–12)
Neutro Abs: 5.4 10*3/uL (ref 1.7–7.7)

## 2011-09-13 LAB — BASIC METABOLIC PANEL
BUN: 6 mg/dL (ref 6–23)
CO2: 29 mEq/L (ref 19–32)
Calcium: 7.7 mg/dL — ABNORMAL LOW (ref 8.4–10.5)
Chloride: 103 mEq/L (ref 96–112)
Creatinine, Ser: 0.68 mg/dL (ref 0.50–1.35)
Glucose, Bld: 126 mg/dL — ABNORMAL HIGH (ref 70–99)

## 2011-09-13 LAB — CBC
HCT: 25.6 % — ABNORMAL LOW (ref 39.0–52.0)
Hemoglobin: 8.8 g/dL — ABNORMAL LOW (ref 13.0–17.0)
MCV: 86.8 fL (ref 78.0–100.0)
RBC: 2.95 MIL/uL — ABNORMAL LOW (ref 4.22–5.81)
RDW: 14.1 % (ref 11.5–15.5)
WBC: 7.7 10*3/uL (ref 4.0–10.5)

## 2011-09-13 SURGERY — OPEN REDUCTION INTERNAL FIXATION (ORIF) PELVIC FRACTURE
Anesthesia: General | Laterality: Right

## 2011-09-13 MED ORDER — POTASSIUM CHLORIDE 10 MEQ/100ML IV SOLN
10.0000 meq | INTRAVENOUS | Status: AC
  Administered 2011-09-13 (×2): 10 meq via INTRAVENOUS

## 2011-09-13 MED ORDER — POTASSIUM CHLORIDE 10 MEQ/100ML IV SOLN
INTRAVENOUS | Status: AC
  Administered 2011-09-13: 10 meq
  Filled 2011-09-13: qty 300
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
## 2011-09-14 ENCOUNTER — Encounter (HOSPITAL_COMMUNITY): Admission: EM | Disposition: A | Discharge status: Skilled Nursing Facility | Payer: Self-pay | Source: Ambulatory Visit

## 2011-09-14 ENCOUNTER — Encounter (HOSPITAL_COMMUNITY): Payer: Self-pay | Admitting: Anesthesiology

## 2011-09-14 ENCOUNTER — Inpatient Hospital Stay (HOSPITAL_COMMUNITY): Payer: Worker's Compensation

## 2011-09-14 ENCOUNTER — Inpatient Hospital Stay (HOSPITAL_COMMUNITY): Payer: Worker's Compensation | Admitting: Anesthesiology

## 2011-09-14 HISTORY — PX: ORIF ANKLE FRACTURE: SHX5408

## 2011-09-14 HISTORY — PX: ORIF PELVIC FRACTURE: SHX2128

## 2011-09-14 LAB — DIFFERENTIAL
Basophils Absolute: 0 10*3/uL (ref 0.0–0.1)
Lymphocytes Relative: 23 % (ref 12–46)
Lymphs Abs: 1.9 10*3/uL (ref 0.7–4.0)
Neutro Abs: 5.2 10*3/uL (ref 1.7–7.7)
Neutrophils Relative %: 63 % (ref 43–77)

## 2011-09-14 LAB — POCT I-STAT 4, (NA,K, GLUC, HGB,HCT)
Glucose, Bld: 136 mg/dL — ABNORMAL HIGH (ref 70–99)
Glucose, Bld: 134 mg/dL — ABNORMAL HIGH (ref 70–99)
Glucose, Bld: 133 mg/dL — ABNORMAL HIGH (ref 70–99)
HCT: 30 % — ABNORMAL LOW (ref 39.0–52.0)
HCT: 27 % — ABNORMAL LOW (ref 39.0–52.0)
Hemoglobin: 10.2 g/dL — ABNORMAL LOW (ref 13.0–17.0)
Potassium: 3.9 mEq/L (ref 3.5–5.1)
Potassium: 4.3 mEq/L (ref 3.5–5.1)
Sodium: 141 mEq/L (ref 135–145)

## 2011-09-14 LAB — TYPE AND SCREEN
ABO/RH(D): O POS
Antibody Screen: NEGATIVE
Unit division: 0
Unit division: 0
Unit division: 0
Unit division: 0
Unit division: 0
Unit division: 0

## 2011-09-14 LAB — BASIC METABOLIC PANEL
Calcium: 7.9 mg/dL — ABNORMAL LOW (ref 8.4–10.5)
Creatinine, Ser: 0.53 mg/dL (ref 0.50–1.35)
GFR calc Af Amer: >90 mL/min (ref >90)
Sodium: 136 mEq/L (ref 135–145)

## 2011-09-14 LAB — CBC
HCT: 25.1 % — ABNORMAL LOW (ref 39.0–52.0)
MCV: 87.2 fL (ref 78.0–100.0)
Platelets: 189 10*3/uL (ref 150–400)
RBC: 3.13 MIL/uL — ABNORMAL LOW (ref 4.22–5.81)
RDW: 14.1 % (ref 11.5–15.5)
RDW: 14.1 % (ref 11.5–15.5)
WBC: 8.3 10*3/uL (ref 4.0–10.5)
WBC: 6.6 10*3/uL (ref 4.0–10.5)

## 2011-09-14 SURGERY — OPEN REDUCTION INTERNAL FIXATION (ORIF) PELVIC FRACTURE
Anesthesia: General | Site: Pelvis | Laterality: Right | Wound class: Clean

## 2011-09-14 MED ORDER — DEXTROSE 5 % IV SOLN
INTRAVENOUS | Status: DC | PRN
  Administered 2011-09-14: 09:00:00 via INTRAVENOUS

## 2011-09-14 MED ORDER — SODIUM CHLORIDE 0.9 % IV SOLN
INTRAVENOUS | Status: DC | PRN
  Administered 2011-09-14: 11:00:00 via INTRAVENOUS

## 2011-09-14 MED ORDER — ENOXAPARIN SODIUM 30 MG/0.3ML ~~LOC~~ SOLN
30.0000 mg | Freq: Two times a day (BID) | SUBCUTANEOUS | Status: DC
  Administered 2011-09-15 – 2011-09-18 (×7): 30 mg via SUBCUTANEOUS
  Filled 2011-09-14 (×9): qty 0.3

## 2011-09-14 MED ORDER — ROCURONIUM BROMIDE 100 MG/10ML IV SOLN
INTRAVENOUS | Status: DC | PRN
  Administered 2011-09-14: 50 mg via INTRAVENOUS

## 2011-09-14 MED ORDER — DROPERIDOL 2.5 MG/ML IJ SOLN
INTRAMUSCULAR | Status: DC | PRN
  Administered 2011-09-14: 0.625 mg via INTRAVENOUS

## 2011-09-14 MED ORDER — SODIUM CHLORIDE 0.9 % IV SOLN
10.0000 mg | INTRAVENOUS | Status: DC | PRN
  Administered 2011-09-14: 80 ug via INTRAVENOUS

## 2011-09-14 MED ORDER — MORPHINE SULFATE 2 MG/ML IJ SOLN: 0.0500 mg/kg | INTRAMUSCULAR | Status: DC | PRN

## 2011-09-14 MED ORDER — MEPERIDINE HCL 25 MG/ML IJ SOLN: 6.2500 mg | INTRAMUSCULAR | Status: DC | PRN

## 2011-09-14 MED ORDER — ONDANSETRON HCL 4 MG/2ML IJ SOLN
4.0000 mg | Freq: Four times a day (QID) | INTRAMUSCULAR | Status: DC | PRN
  Administered 2011-09-26: 4 mg via INTRAVENOUS
  Filled 2011-09-14: qty 2

## 2011-09-14 MED ORDER — HETASTARCH-ELECTROLYTES 6 % IV SOLN
INTRAVENOUS | Status: DC | PRN
  Administered 2011-09-14 (×2): via INTRAVENOUS

## 2011-09-14 MED ORDER — ALBUMIN HUMAN 5 % IV SOLN
INTRAVENOUS | Status: DC | PRN
  Administered 2011-09-14 (×2): via INTRAVENOUS

## 2011-09-14 MED ORDER — VECURONIUM BROMIDE 10 MG IV SOLR
INTRAVENOUS | Status: DC | PRN
  Administered 2011-09-14: 5 mg via INTRAVENOUS
  Administered 2011-09-14 (×2): 3 mg via INTRAVENOUS
  Administered 2011-09-14: 2 mg via INTRAVENOUS

## 2011-09-14 MED ORDER — METOCLOPRAMIDE HCL 5 MG/ML IJ SOLN
INTRAMUSCULAR | Status: DC | PRN
  Administered 2011-09-14: 10 mg via INTRAVENOUS

## 2011-09-14 MED ORDER — ONDANSETRON HCL 4 MG PO TABS: 4.0000 mg | ORAL_TABLET | Freq: Four times a day (QID) | ORAL | Status: DC | PRN

## 2011-09-14 MED ORDER — DEXAMETHASONE SODIUM PHOSPHATE 4 MG/ML IJ SOLN
INTRAMUSCULAR | Status: DC | PRN
  Administered 2011-09-14: 8 mg via INTRAVENOUS

## 2011-09-14 MED ORDER — FENTANYL CITRATE 0.05 MG/ML IJ SOLN
INTRAMUSCULAR | Status: DC | PRN
  Administered 2011-09-14 (×3): 100 ug via INTRAVENOUS
  Administered 2011-09-14 (×3): 50 ug via INTRAVENOUS
  Administered 2011-09-14 (×3): 100 ug via INTRAVENOUS
  Administered 2011-09-14: 50 ug via INTRAVENOUS

## 2011-09-14 MED ORDER — METOCLOPRAMIDE HCL 5 MG PO TABS
5.0000 mg | ORAL_TABLET | Freq: Three times a day (TID) | ORAL | Status: DC | PRN
  Filled 2011-09-14: qty 2

## 2011-09-14 MED ORDER — NEOSTIGMINE METHYLSULFATE 1 MG/ML IJ SOLN
INTRAMUSCULAR | Status: DC | PRN
  Administered 2011-09-14: 5 mg via INTRAVENOUS

## 2011-09-14 MED ORDER — LIDOCAINE HCL (CARDIAC) 20 MG/ML IV SOLN
INTRAVENOUS | Status: DC | PRN
  Administered 2011-09-14: 100 mg via INTRAVENOUS

## 2011-09-14 MED ORDER — LACTATED RINGERS IV SOLN
INTRAVENOUS | Status: DC | PRN
  Administered 2011-09-14 (×3): via INTRAVENOUS

## 2011-09-14 MED ORDER — PROPOFOL 10 MG/ML IV EMUL
INTRAVENOUS | Status: DC | PRN
  Administered 2011-09-14: 200 mg via INTRAVENOUS

## 2011-09-14 MED ORDER — PHENYLEPHRINE HCL 10 MG/ML IJ SOLN
INTRAMUSCULAR | Status: DC | PRN
  Administered 2011-09-14 (×4): 80 ug via INTRAVENOUS

## 2011-09-14 MED ORDER — METOCLOPRAMIDE HCL 5 MG/ML IJ SOLN
5.0000 mg | Freq: Three times a day (TID) | INTRAMUSCULAR | Status: DC | PRN
Start: 2011-09-14 — End: 2011-09-24
  Filled 2011-09-14: qty 2

## 2011-09-14 MED ORDER — CEFAZOLIN SODIUM 1-5 GM-% IV SOLN
1.0000 g | Freq: Four times a day (QID) | INTRAVENOUS | Status: AC
  Administered 2011-09-14 – 2011-09-15 (×3): 1 g via INTRAVENOUS
  Filled 2011-09-14 (×4): qty 50

## 2011-09-14 MED ORDER — HYDROMORPHONE HCL PF 1 MG/ML IJ SOLN
0.2500 mg | INTRAMUSCULAR | Status: DC | PRN
  Administered 2011-09-14 (×2): 0.5 mg via INTRAVENOUS

## 2011-09-14 MED ORDER — GLYCOPYRROLATE 0.2 MG/ML IJ SOLN
INTRAMUSCULAR | Status: DC | PRN
  Administered 2011-09-14: .6 mg via INTRAVENOUS

## 2011-09-14 MED ORDER — ONDANSETRON HCL 4 MG/2ML IJ SOLN
INTRAMUSCULAR | Status: DC | PRN
  Administered 2011-09-14: 4 mg via INTRAVENOUS

## 2011-09-14 MED ORDER — 0.9 % SODIUM CHLORIDE (POUR BTL) OPTIME
TOPICAL | Status: DC | PRN
  Administered 2011-09-14 (×2): 1000 mL

## 2011-09-14 MED ORDER — ONDANSETRON HCL 4 MG/2ML IJ SOLN: 4.0000 mg | Freq: Once | INTRAMUSCULAR | Status: DC | PRN

## 2011-09-14 SURGICAL SUPPLY — 80 items
110mm (Screw) ×2 IMPLANT
BANDAGE ELASTIC 6 VELCRO ST LF (GAUZE/BANDAGES/DRESSINGS) ×1 IMPLANT
BIT DRILL 2.5X110 QC LCP DISP (BIT) ×1 IMPLANT
BIT DRILL AO MATTA 2.5MX230M (BIT) IMPLANT
BIT DRILL QC 3.5X110 (BIT) ×1 IMPLANT
BLADE SURG ROTATE 9660 (MISCELLANEOUS) ×1 IMPLANT
BRUSH SCRUB DISP (MISCELLANEOUS) ×6 IMPLANT
CLOTH BEACON ORANGE TIMEOUT ST (SAFETY) ×3 IMPLANT
COVER SURGICAL LIGHT HANDLE (MISCELLANEOUS) ×2 IMPLANT
DRAIN CHANNEL 15F RND FF W/TCR (WOUND CARE) ×3 IMPLANT
DRAIN PENROSE 3/4X12 (DRAIN) ×1 IMPLANT
DRAPE C-ARM 42X72 X-RAY (DRAPES) ×4 IMPLANT
DRAPE C-ARMOR (DRAPES) ×4 IMPLANT
DRAPE INCISE IOBAN 66X45 STRL (DRAPES) ×4 IMPLANT
DRAPE ORTHO SPLIT 77X108 STRL (DRAPES) ×6
DRAPE SURG ORHT 6 SPLT 77X108 (DRAPES) ×4 IMPLANT
DRAPE U-SHAPE 47X51 STRL (DRAPES) ×6 IMPLANT
DRILL BIT AO MATTA 2.5MX230M (BIT) ×3
DRSG ADAPTIC 3X8 NADH LF (GAUZE/BANDAGES/DRESSINGS) ×3 IMPLANT
DRSG MEPILEX BORDER 4X4 (GAUZE/BANDAGES/DRESSINGS) ×2 IMPLANT
DRSG MEPILEX BORDER 4X8 (GAUZE/BANDAGES/DRESSINGS) ×1 IMPLANT
DRSG PAD ABDOMINAL 8X10 ST (GAUZE/BANDAGES/DRESSINGS) ×6 IMPLANT
ELECT BLADE 4.0 EZ CLEAN MEGAD (MISCELLANEOUS) ×3
ELECT REM PT RETURN 9FT ADLT (ELECTROSURGICAL) ×3
ELECTRODE BLDE 4.0 EZ CLN MEGD (MISCELLANEOUS) IMPLANT
ELECTRODE REM PT RTRN 9FT ADLT (ELECTROSURGICAL) ×2 IMPLANT
EVACUATOR SILICONE 100CC (DRAIN) ×3 IMPLANT
GLOVE BIO SURGEON STRL SZ7.5 (GLOVE) ×4 IMPLANT
GLOVE BIO SURGEON STRL SZ8 (GLOVE) ×5 IMPLANT
GLOVE BIOGEL PI IND STRL 7.5 (GLOVE) ×2 IMPLANT
GLOVE BIOGEL PI IND STRL 8 (GLOVE) ×2 IMPLANT
GLOVE BIOGEL PI INDICATOR 7.5 (GLOVE) ×1
GLOVE BIOGEL PI INDICATOR 8 (GLOVE) ×1
GOWN PREVENTION PLUS XLARGE (GOWN DISPOSABLE) ×3 IMPLANT
GOWN STRL NON-REIN LRG LVL3 (GOWN DISPOSABLE) ×7 IMPLANT
GUIDE WIRE ×2 IMPLANT
GUIDEPIN 2.8X300 THRD TIP OIC (Screw) ×2 IMPLANT
KIT BASIN OR (CUSTOM PROCEDURE TRAY) ×3 IMPLANT
KIT ROOM TURNOVER OR (KITS) ×3 IMPLANT
MANIFOLD NEPTUNE II (INSTRUMENTS) ×3 IMPLANT
NS IRRIG 1000ML POUR BTL (IV SOLUTION) ×7 IMPLANT
PACK ORTHO EXTREMITY (CUSTOM PROCEDURE TRAY) ×1 IMPLANT
PACK TOTAL JOINT (CUSTOM PROCEDURE TRAY) ×3 IMPLANT
PAD ARMBOARD 7.5X6 YLW CONV (MISCELLANEOUS) ×6 IMPLANT
PLATE LCP 3.5 1/3 TUB 7HX81 (Plate) ×1 IMPLANT
PLATE SYMPHYSIS 92.5M 6H (Plate) ×1 IMPLANT
SCREW CANC FT ST SFS 4X16 (Screw) ×1 IMPLANT
SCREW CANC FT/18 4.0 (Screw) ×1 IMPLANT
SCREW CORTEX 3.5 12MM (Screw) ×1 IMPLANT
SCREW CORTEX 3.5 14MM (Screw) ×3 IMPLANT
SCREW CORTEX 3.5 45MM (Screw) ×2 IMPLANT
SCREW CORTEX ST MATTA 3.5X32MM (Screw) ×3 IMPLANT
SCREW CORTEX ST MATTA 3.5X36MM (Screw) ×1 IMPLANT
SCREW CORTEX ST MATTA 3.5X38M (Screw) ×2 IMPLANT
SCREW CORTEX ST MATTA 3.5X40MM (Screw) ×2 IMPLANT
SCREW CORTEX ST MATTA 3.5X45MM (Screw) ×1 IMPLANT
SCREW LOCK CORT ST 3.5X12 (Screw) IMPLANT
SCREW LOCK CORT ST 3.5X14 (Screw) IMPLANT
SPLINT FAST PLASTER 5X30 (CAST SUPPLIES) ×1
SPLINT PLASTER CAST FAST 5X30 (CAST SUPPLIES) IMPLANT
SPONGE GAUZE 4X4 12PLY (GAUZE/BANDAGES/DRESSINGS) ×6 IMPLANT
SPONGE GAUZE 4X4 STERILE 39 (GAUZE/BANDAGES/DRESSINGS) ×2 IMPLANT
SPONGE LAP 18X18 X RAY DECT (DISPOSABLE) IMPLANT
STAPLER VISISTAT 35W (STAPLE) ×3 IMPLANT
SUCTION FRAZIER TIP 10 FR DISP (SUCTIONS) ×3 IMPLANT
SUT VIC AB 0 CT1 27 (SUTURE) ×12
SUT VIC AB 0 CT1 27XBRD ANBCTR (SUTURE) ×8 IMPLANT
SUT VIC AB 1 CT1 18XCR BRD 8 (SUTURE) ×4 IMPLANT
SUT VIC AB 1 CT1 27 (SUTURE) ×6
SUT VIC AB 1 CT1 27XBRD ANBCTR (SUTURE) IMPLANT
SUT VIC AB 1 CT1 8-18 (SUTURE) ×6
SUT VIC AB 2-0 CT1 27 (SUTURE) ×12
SUT VIC AB 2-0 CT1 TAPERPNT 27 (SUTURE) ×8 IMPLANT
TAPE HYPAFIX 4 X10 (GAUZE/BANDAGES/DRESSINGS) ×1 IMPLANT
TAPE HYPAFIX 4 X30'CHARGABLE (GAUZE/BANDAGES/DRESSINGS) ×1 IMPLANT
TOWEL OR 17X24 6PK STRL BLUE (TOWEL DISPOSABLE) ×3 IMPLANT
TOWEL OR 17X26 10 PK STRL BLUE (TOWEL DISPOSABLE) ×6 IMPLANT
TRAY FOLEY CATH 14FR (SET/KITS/TRAYS/PACK) IMPLANT
WATER STERILE IRR 1000ML POUR (IV SOLUTION) ×8 IMPLANT
washer (Washer) ×2 IMPLANT

## 2011-09-14 NOTE — Transfer of Care (Signed)
Immediate Anesthesia Transfer of Care Note  Patient: Theodore Gonzalez  Procedure(s) Performed:  OPEN REDUCTION INTERNAL FIXATION (ORIF) PELVIC FRACTURE; OPEN REDUCTION INTERNAL FIXATION (ORIF) ANKLE FRACTURE  Patient Location: PACU  Anesthesia Type: General  Level of Consciousness: sedated and responds to stimulation  Airway & Oxygen Therapy: Patient Spontanous Breathing and Patient connected to face mask oxygen  Post-op Assessment: Report given to PACU RN, Post -op Vital signs reviewed and stable and Patient moving all extremities  Post vital signs: Reviewed and stable  Complications: No apparent anesthesia complications

## 2011-09-14 NOTE — Anesthesia Preprocedure Evaluation (Addendum)
Anesthesia Evaluation  Patient identified by MRN, date of birth, ID band Patient awake    Reviewed: Allergy & Precautions, H&P , NPO status , Patient's Chart, lab work & pertinent test results  Airway Mallampati: II TM Distance: >3 FB Neck ROM: Full    Dental No notable dental hx. (+) Teeth Intact and Dental Advisory Given   Pulmonary  clear to auscultation        Cardiovascular hypertension, Pt. on medications Regular Normal    Neuro/Psych    GI/Hepatic GERD-  Controlled,  Endo/Other    Renal/GU      Musculoskeletal   Abdominal   Peds  Hematology   Anesthesia Other Findings   Reproductive/Obstetrics                         Anesthesia Physical Anesthesia Plan  ASA: II  Anesthesia Plan: General   Post-op Pain Management:    Induction: Intravenous  Airway Management Planned: Oral ETT  Additional Equipment:   Intra-op Plan:   Post-operative Plan: Extubation in OR  Informed Consent: I have reviewed the patients History and Physical, chart, labs and discussed the procedure including the risks, benefits and alternatives for the proposed anesthesia with the patient or authorized representative who has indicated his/her understanding and acceptance.   Dental advisory given  Plan Discussed with: CRNA, Anesthesiologist and Surgeon  Anesthesia Plan Comments:         Anesthesia Quick Evaluation

## 2011-09-14 NOTE — Preoperative (Signed)
Beta Blockers   Reason not to administer Beta Blockers:Not Applicable 
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
## 2011-09-14 NOTE — Anesthesia Procedure Notes (Addendum)
Procedure Name: Intubation Date/Time: 09/14/2011 8:25 AM Performed by: Wray Kearns A Pre-anesthesia Checklist: Patient identified, Timeout performed, Emergency Drugs available, Suction available and Patient being monitored Patient Re-evaluated:Patient Re-evaluated prior to inductionOxygen Delivery Method: Circle System Utilized Preoxygenation: Pre-oxygenation with 100% oxygen Intubation Type: IV induction and Circoid Pressure applied Ventilation: Mask ventilation without difficulty and Oral airway inserted - appropriate to patient size Laryngoscope Size: Mac and 4 Grade View: Grade II Tube type: Oral Tube size: 8.0 mm Number of attempts: 2 Airway Equipment and Method: bougie stylet Placement Confirmation: ETT inserted through vocal cords under direct vision,  breath sounds checked- equal and bilateral and positive ETCO2 Secured at: 23 cm Tube secured with: Tape Dental Injury: Teeth and Oropharynx as per pre-operative assessment  Difficulty Due To: Difficult Airway- due to reduced neck mobility, Difficulty was anticipated and Difficult Airway- due to large tongue
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
## 2011-09-14 NOTE — Brief Op Note (Signed)
09/10/2011 - 09/14/2011  2:17 PM  PATIENT:  Theodore Gonzalez  55 y.o. male  PRE-OPERATIVE DIAGNOSIS:  1) fractured pelvis 2) right ankle fracture Weber C bimalleolar equivalent with syndesmosis  POST-OPERATIVE DIAGNOSIS:  1) fractured pelvis 2)right ankle fracture Weber C bimalleolar equivalent with syndesmosis  PROCEDURE:  Procedure(s): 1. OPEN REDUCTION INTERNAL FIXATION (ORIF) PELVIC FRACTURE symphisis 2. Right and left SI screw placement 3. OPEN REDUCTION INTERNAL FIXATION (ORIF) ANKLE FRACTURE lateral malleolus 4. Repair ankle syndesmosis 5. Removal external fixator under anesthesia pelvis 6. Repair of rectus diastasis, traumatic                           SURGEON:  Surgeon(s): Ilean Skill Baylin Cabal  PHYSICIAN ASSISTANT: Montez Morita, [facility]  ANESTHESIA:   general  EBL:  Total I/O In: 5750 [I.V.:3550; Blood:700; IV Piggyback:1500] Out: 2700 [Urine:1100; Blood:1600]  BLOOD ADMINISTERED:700 CC PRBC  DRAINS: (pelvis) Jackson-Pratt drain(s) with closed bulb suction in the pelvis   LOCAL MEDICATIONS USED:  NONE  SPECIMEN:  No Specimen  DISPOSITION OF SPECIMEN:  PATHOLOGY  COUNTS:  YES  TOURNIQUET:  * No tourniquets in log *  DICTATION: .Other Dictation: Dictation Number [id number]  PLAN OF CARE: Admit to inpatient   PATIENT DISPOSITION:  PACU - hemodynamically stable.   Delay start of Pharmacological VTE agent (>24hrs) due to surgical blood loss or risk of bleeding:  NO

## 2011-09-14 NOTE — Anesthesia Postprocedure Evaluation (Signed)
  Anesthesia Post-op Note  Patient: Theodore Gonzalez  Procedure(s) Performed:  OPEN REDUCTION INTERNAL FIXATION (ORIF) PELVIC FRACTURE; OPEN REDUCTION INTERNAL FIXATION (ORIF) ANKLE FRACTURE  Patient Location: PACU  Anesthesia Type: General  Level of Consciousness: sedated  Airway and Oxygen Therapy: Patient connected to face mask oxygen  Post-op Pain: none  Post-op Assessment: Post-op Vital signs reviewed, Patient's Cardiovascular Status Stable, Respiratory Function Stable, Patent Airway, No signs of Nausea or vomiting and Pain level controlled  Post-op Vital Signs: Reviewed and stable  Complications: No apparent anesthesia complications

## 2011-09-14 NOTE — OR Nursing (Signed)
Dr. Carola Frost decided to ORIF the patient's right ankle. Second procedure started at 1250.  The right leg circumferentially from the knee down was held by Dionte, and prepped by Mikel Cella, RN.    Prep was betadine scrub and paint.

## 2011-09-15 LAB — CBC
MCH: 29.6 pg (ref 26.0–34.0)
MCV: 88.2 fL (ref 78.0–100.0)
Platelets: 156 10*3/uL (ref 150–400)
RDW: 14.2 % (ref 11.5–15.5)

## 2011-09-15 LAB — BASIC METABOLIC PANEL
Calcium: 7.4 mg/dL — ABNORMAL LOW (ref 8.4–10.5)
Creatinine, Ser: 0.64 mg/dL (ref 0.50–1.35)
Sodium: 138 mEq/L (ref 135–145)

## 2011-09-15 MED ORDER — VALSARTAN-HYDROCHLOROTHIAZIDE 160-25 MG PO TABS: 1.0000 | ORAL_TABLET | Freq: Every day | ORAL | Status: DC

## 2011-09-15 MED ORDER — AMLODIPINE BESYLATE 5 MG PO TABS
5.0000 mg | ORAL_TABLET | Freq: Every day | ORAL | Status: DC
  Administered 2011-09-16 – 2011-09-27 (×8): 5 mg via ORAL
  Filled 2011-09-15 (×13): qty 1

## 2011-09-15 MED ORDER — PANTOPRAZOLE SODIUM 40 MG PO TBEC
40.0000 mg | DELAYED_RELEASE_TABLET | Freq: Every day | ORAL | Status: DC
  Administered 2011-09-15 – 2011-09-23 (×9): 40 mg via ORAL
  Filled 2011-09-15 (×8): qty 1

## 2011-09-15 MED ORDER — OLMESARTAN MEDOXOMIL 20 MG PO TABS
20.0000 mg | ORAL_TABLET | Freq: Every day | ORAL | Status: DC
  Administered 2011-09-15 – 2011-09-20 (×6): 20 mg via ORAL
  Filled 2011-09-15 (×7): qty 1

## 2011-09-15 MED ORDER — HYDROCHLOROTHIAZIDE 25 MG PO TABS
25.0000 mg | ORAL_TABLET | Freq: Every day | ORAL | Status: DC
  Administered 2011-09-15 – 2011-09-20 (×6): 25 mg via ORAL
  Filled 2011-09-15 (×7): qty 1

## 2011-09-15 NOTE — Progress Notes (Signed)
Subjective: Feeling less pain since repair; reports occassional tingling in his toes  Objective: Vital signs in last 24 hours: Temp:  [97.9 F (36.6 C)-99.6 F (37.6 C)] 98.3 F (36.8 C) (12/22 0708) Pulse Rate:  [88-112] 88  (12/22 0800) Resp:  [14-25] 22  (12/22 0800) BP: (104-148)/(51-79) 113/72 mmHg (12/22 0800) SpO2:  [88 %-100 %] 94 % (12/22 0800) FiO2 (%):  [50 %-70 %] 50 % (12/21 1700) Weight change:  Last BM Date: 09/10/11  Intake/Output from previous day: 12/21 0701 - 12/22 0700 In: 7196.4 [P.O.:320; I.V.:4526.4; Blood:700; IV Piggyback:1650] Out: 5740 [Urine:3800; Drains:140; Blood:1800] Intake/Output this shift: Total I/O In: 75 [I.V.:75] Out: 250 [Urine:250]  Wound: clean and dry abd, serous from pin sites; JP partially pulled out so removed entirely MSK: sensation intact bilat DPN,SPN,TN and flex-ext great and lesser toes; splint fitting well; knees nontender  Lab Results:  [facility] 09/15/11 0325 09/14/11 1822  WBC 6.2 6.6  HGB 8.5* 8.6*  HCT 25.3* 25.1*  PLT 156 162   BMET  Basename 09/15/11 0325 09/14/11 1202 09/14/11 0405  NA 138 137 --  K 3.9 4.3 --  CL 104 -- 102  CO2 32 -- 29  GLUCOSE 124* 134* --  BUN 11 -- 7  CREATININE 0.64 -- 0.53  CALCIUM 7.4* -- 7.9*    Studies/Results: Dg Pelvis 1-2 Views  09/14/2011  *RADIOLOGY REPORT*  Clinical Data: Pelvic fracture.  PELVIS - 1-2 VIEW  Comparison: Multiple priors.  Findings: Bilateral sacroiliac joint screws have been placed along with screw and plate fixation across an anterior pelvic pubic symphysis diastasis. External fixator screws are seen on lateral view.  IMPRESSION: Satisfactory position and alignment ORIF pelvic fracture.  Original Report Authenticated By: Elsie Stain, M.D.   Dg Ankle Complete Right  09/14/2011  *RADIOLOGY REPORT*  Clinical Data: ORIF for ankle fracture.  RIGHT ANKLE - COMPLETE 3+ VIEW  Comparison: 09/10/2011  Findings: Three intraoperative spot fluoro films are  submitted after the procedure.  The show lateral plate screw fixation of the distal fibula with two screws extending through the syndesmosis. Alignment at the mortise is markedly improved.  IMPRESSION: Status post ORIF for fracture dislocation at the ankle.  No evidence for immediate hardware complications.  Original Report Authenticated By: ERIC A. MANSELL, M.D.   Dg Ankle Right Port  09/14/2011  *RADIOLOGY REPORT*  Clinical Data: 55 year old male status post right ankle ORIF.  PORTABLE RIGHT ANKLE - 2 VIEW  Comparison: Intraoperative radiographs from earlier the same day and earlier.  Findings: .  Distal fibula plate and screw fixation.  Casting material about the ankle.  Two screws traverse the tibiofibular syndesmosis.  Normal mortise joint alignment.  Question minimally- displaced tip of the lateral malleolus.  IMPRESSION: ORIF right ankle.  Question minimally-displaced fracture at the tip of the lateral malleolus.  Original Report Authenticated By: Harley Hallmark, M.D.   Dg C-arm Gt 120 Min  09/14/2011  CLINICAL DATA: intraoperative   C-ARM GT 120 MIN  Fluoroscopy was utilized by the requesting physician.  No radiographic  interpretation.     Dg Pelvis 3v Inlet/outlet  09/14/2011  *RADIOLOGY REPORT*  Clinical Data: 55 year old male status post ORIF of the pelvis.  DG PELVIS 3V-INLET/OUTLET  Comparison: Intraoperative radiographs 1127 hours the same day and earlier.  Findings: Portable supine views of the pelvis.  Bilateral cannulated screws traverse the SI joints and sacrum.  Symphysis pubis malleable plate and screw fixation.  Foley catheter and anterior postoperative drain.  Femoral heads  appear normally located.  IMPRESSION: ORIF of the sacrum and pubic symphysis.  No adverse features.  Original Report Authenticated By: Harley Hallmark, M.D.    DVT prophylaxis: Lovenox starts today  Assessment/Plan: POD #2 s/p Procedure(s): OPEN REDUCTION INTERNAL FIXATION (ORIF) PELVIC FRACTURE OPEN  REDUCTION INTERNAL FIXATION (ORIF) ANKLE FRACTURE Left metatarsal fractures 3rd and 5th Weight bearing: NWB bilaterally for 2.5-3 months!   LOS: 5 days   Dayja Loveridge H 09/15/2011, 9:24 AM
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
## 2011-09-15 NOTE — Progress Notes (Signed)
Orthopedic Tech Progress Note Patient Details:  JAQUIN COY [birth year] [id number]  Other Ortho Devices Type of Ortho Device: Other (comment) (sling immobilizer) Ortho Device Location: right arm Ortho Device Interventions: Application   Nikki Dom 09/15/2011, 10:22 PM

## 2011-09-15 NOTE — Progress Notes (Signed)
PT Cancellation Note PT order received and chart reviewed.  Patient s/p surgery on pelvis yesterday.  Awaiting RUE sling - RN obtaining.  Patient with increased pain.  Will have nursing move patient to chair using lift equipment (patient NWB bil. LE's and RUE).  Will return for PT evaluation at later date. Durenda Hurt Renaldo Fiddler, [facility] Pager [phone]

## 2011-09-16 LAB — GLUCOSE, CAPILLARY: Glucose-Capillary: 122 mg/dL — ABNORMAL HIGH (ref 70–99)

## 2011-09-16 LAB — CBC
Platelets: 209 10*3/uL (ref 150–400)
RBC: 3.3 MIL/uL — ABNORMAL LOW (ref 4.22–5.81)
RDW: 14 % (ref 11.5–15.5)
WBC: 7.6 10*3/uL (ref 4.0–10.5)

## 2011-09-16 MED ORDER — WARFARIN SODIUM 7.5 MG PO TABS
7.5000 mg | ORAL_TABLET | Freq: Once | ORAL | Status: AC
  Administered 2011-09-16: 7.5 mg via ORAL
  Filled 2011-09-16: qty 1

## 2011-09-16 MED ORDER — WARFARIN VIDEO
Freq: Once | Status: AC
  Administered 2011-09-18 (×2)

## 2011-09-16 MED ORDER — PATIENT'S GUIDE TO USING COUMADIN BOOK
Freq: Once | Status: AC
  Administered 2011-09-17: 08:00:00
  Filled 2011-09-16: qty 1
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
## 2011-09-16 NOTE — Progress Notes (Signed)
Trauma Service Note  Subjective: Sleeping comfortably.  Minimal complaints.  Objective: Vital signs in last 24 hours: Temp:  [97.9 F (36.6 C)-99.1 F (37.3 C)] 97.9 F (36.6 C) (12/23 0703) Pulse Rate:  [90-106] 97  (12/23 0800) Resp:  [11-20] 17  (12/23 0800) BP: (109-142)/(60-87) 120/86 mmHg (12/23 0800) SpO2:  [93 %-100 %] 99 % (12/23 0800) Weight:  [231 lb 14.8 oz (105.2 kg)] 231 lb 14.8 oz (105.2 kg) (12/23 0500) Last BM Date: 09/10/11  Intake/Output from previous day: 12/22 0701 - 12/23 0700 In: 2205 [P.O.:480; I.V.:1725] Out: 4800 [Urine:4800] Intake/Output this shift: Total I/O In: 75 [I.V.:75] Out: 400 [Urine:400]  General: comfortable, no distress  Lungs: Clear to auscultation.  Abd: Distended, soft, good bowel sounds  Extremities: Massively swollen scrotum, ecchymotic, Splints on both LEs.  Neuro: Intact  Lab Results: CBC   Basename 09/16/11 0515 09/15/11 0325  WBC 7.6 6.2  HGB 9.6* 8.5*  HCT 29.0* 25.3*  PLT 209 156   BMET  Basename 09/15/11 0325 09/14/11 1202 09/14/11 0405  NA 138 137 --  K 3.9 4.3 --  CL 104 -- 102  CO2 32 -- 29  GLUCOSE 124* 134* --  BUN 11 -- 7  CREATININE 0.64 -- 0.53  CALCIUM 7.4* -- 7.9*   PT/INR No results found for this basename: LABPROT:2,INR:2 in the last 72 hours ABG No results found for this basename: PHART:2,PCO2:2,PO2:2,HCO3:2 in the last 72 hours  Studies/Results: Dg Pelvis 1-2 Views  09/14/2011  *RADIOLOGY REPORT*  Clinical Data: Pelvic fracture.  PELVIS - 1-2 VIEW  Comparison: Multiple priors.  Findings: Bilateral sacroiliac joint screws have been placed along with screw and plate fixation across an anterior pelvic pubic symphysis diastasis. External fixator screws are seen on lateral view.  IMPRESSION: Satisfactory position and alignment ORIF pelvic fracture.  Original Report Authenticated By: Elsie Stain, M.D.   Dg Ankle Complete Right  09/14/2011  *RADIOLOGY REPORT*  Clinical Data: ORIF for  ankle fracture.  RIGHT ANKLE - COMPLETE 3+ VIEW  Comparison: 09/10/2011  Findings: Three intraoperative spot fluoro films are submitted after the procedure.  The show lateral plate screw fixation of the distal fibula with two screws extending through the syndesmosis. Alignment at the mortise is markedly improved.  IMPRESSION: Status post ORIF for fracture dislocation at the ankle.  No evidence for immediate hardware complications.  Original Report Authenticated By: ERIC A. MANSELL, M.D.   Dg Ankle Right Port  09/14/2011  *RADIOLOGY REPORT*  Clinical Data: 55 year old male status post right ankle ORIF.  PORTABLE RIGHT ANKLE - 2 VIEW  Comparison: Intraoperative radiographs from earlier the same day and earlier.  Findings: .  Distal fibula plate and screw fixation.  Casting material about the ankle.  Two screws traverse the tibiofibular syndesmosis.  Normal mortise joint alignment.  Question minimally- displaced tip of the lateral malleolus.  IMPRESSION: ORIF right ankle.  Question minimally-displaced fracture at the tip of the lateral malleolus.  Original Report Authenticated By: Harley Hallmark, M.D.   Dg C-arm Gt 120 Min  09/14/2011  CLINICAL DATA: intraoperative   C-ARM GT 120 MIN  Fluoroscopy was utilized by the requesting physician.  No radiographic  interpretation.     Dg Pelvis 3v Inlet/outlet  09/14/2011  *RADIOLOGY REPORT*  Clinical Data: 55 year old male status post ORIF of the pelvis.  DG PELVIS 3V-INLET/OUTLET  Comparison: Intraoperative radiographs 1127 hours the same day and earlier.  Findings: Portable supine views of the pelvis.  Bilateral cannulated screws traverse the SI  joints and sacrum.  Symphysis pubis malleable plate and screw fixation.  Foley catheter and anterior postoperative drain.  Femoral heads appear normally located.  IMPRESSION: ORIF of the sacrum and pubic symphysis.  No adverse features.  Original Report Authenticated By: Harley Hallmark, M.D.     Anti-infectives: Anti-infectives     Start     Dose/Rate Route Frequency Ordered Stop   09/14/11 1630   ceFAZolin (ANCEF) IVPB 1 g/50 mL premix        1 g 100 mL/hr over 30 Minutes Intravenous Every 6 hours 09/14/11 1626 09/15/11 0450   09/13/11 0600   ceFAZolin (ANCEF) IVPB 2 g/50 mL premix        2 g 100 mL/hr over 30 Minutes Intravenous  Once 09/12/11 0933 09/14/11 0830   09/11/11 0600   ceFAZolin (ANCEF) IVPB 2 g/50 mL premix        2 g 100 mL/hr over 30 Minutes Intravenous 3 times per day 09/11/11 0233 09/12/11 1636   09/10/11 2200   ceFAZolin (ANCEF) IVPB 2 g/50 mL premix  Status:  Discontinued        2 g 100 mL/hr over 30 Minutes Intravenous 3 times per day 09/10/11 2159 09/11/11 0233   09/10/11 2049   ceFAZolin (ANCEF) 1-5 GM-% IVPB     Comments: FORREST, ZACH: cabinet override         09/10/11 2049 09/10/11 2113   09/10/11 2046   ceFAZolin (ANCEF) 1 G injection  Status:  Discontinued     Comments: FORREST, ZACH: cabinet override         09/10/11 2046 09/10/11 2050          Assessment/Plan: s/p Procedure(s): OPEN REDUCTION INTERNAL FIXATION (ORIF) PELVIC FRACTURE OPEN REDUCTION INTERNAL FIXATION (ORIF) ANKLE FRACTURE transfer to SDU still appropriate.   LOS: 6 days   Marta Lamas. Gae Bon, MD, FACS [phone] Trauma Surgeon 09/16/2011
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
## 2011-09-17 ENCOUNTER — Other Ambulatory Visit: Payer: Self-pay

## 2011-09-17 LAB — TYPE AND SCREEN
ABO/RH(D): O POS
Unit division: 0
Unit division: 0
Unit division: 0

## 2011-09-17 LAB — DIFFERENTIAL
Basophils Absolute: 0 10*3/uL (ref 0.0–0.1)
Basophils Relative: 0 % (ref 0–1)
Eosinophils Absolute: 0.1 10*3/uL (ref 0.0–0.7)
Lymphocytes Relative: 14 % (ref 12–46)
Lymphs Abs: 1.2 10*3/uL (ref 0.7–4.0)

## 2011-09-17 LAB — PROTIME-INR
INR: 1.2 (ref 0.00–1.49)
Prothrombin Time: 15.5 s — ABNORMAL HIGH (ref 11.6–15.2)

## 2011-09-17 LAB — CBC
HCT: 30.6 % — ABNORMAL LOW (ref 39.0–52.0)
MCV: 87.7 fL (ref 78.0–100.0)
RBC: 3.49 MIL/uL — ABNORMAL LOW (ref 4.22–5.81)
WBC: 8.5 10*3/uL (ref 4.0–10.5)

## 2011-09-17 LAB — CARDIAC PANEL(CRET KIN+CKTOT+MB+TROPI)
CK, MB: 1.7 ng/mL (ref 0.3–4.0)
Relative Index: 0.3 (ref 0.0–2.5)
Total CK: 495 U/L — ABNORMAL HIGH (ref 7–232)
Troponin I: <0.3 ng/mL (ref <0.30)

## 2011-09-17 LAB — CLOSTRIDIUM DIFFICILE BY PCR: Toxigenic C. Difficile by PCR: POSITIVE — AB

## 2011-09-17 MED ORDER — METRONIDAZOLE 500 MG PO TABS
500.0000 mg | ORAL_TABLET | Freq: Three times a day (TID) | ORAL | Status: DC
  Administered 2011-09-17 – 2011-09-27 (×30): 500 mg via ORAL
  Filled 2011-09-17 (×35): qty 1

## 2011-09-17 MED ORDER — WARFARIN SODIUM 7.5 MG PO TABS
7.5000 mg | ORAL_TABLET | Freq: Once | ORAL | Status: AC
  Administered 2011-09-17: 7.5 mg via ORAL
  Filled 2011-09-17: qty 1

## 2011-09-17 MED ORDER — SODIUM CHLORIDE 0.9 % IV SOLN
500.0000 mL | Freq: Once | INTRAVENOUS | Status: AC
  Administered 2011-09-17: 500 mL via INTRAVENOUS
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
## 2011-09-17 NOTE — Progress Notes (Signed)
UR of chart updated. Clinical update faxed to [facility] CM.

## 2011-09-17 NOTE — Plan of Care (Signed)
Problem: Phase II Progression Outcomes Goal: Bed to chair transfers BID Outcome: Progressing Pt is NWB on bil LE--lift to chair x 1 12/24--tolerated well Goal: Tolerating diet Outcome: Progressing Appetite is decreased and abdomen distended, pt is taking POs without problems

## 2011-09-17 NOTE — Plan of Care (Signed)
Problem: Phase II Progression Outcomes Goal: Bed to chair transfers BID Outcome: Progressing Started today using a Maxi Sky lift.  Pt tolerated it well.

## 2011-09-17 NOTE — Progress Notes (Signed)
Cdiff positive, Dr. Janee Morn made aware.
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
## 2011-09-17 NOTE — Progress Notes (Signed)
Physical Therapy Evaluation Patient Details Name: Theodore Gonzalez MRN: [id number] DOB: 11/24/1955 Today's Date: 09/17/2011  Problem List:  Patient Active Problem List  Diagnoses  . Pelvic fracture  . Dislocation of right shoulder joint  . Dislocation of right ankle joint  . Closed right ankle fracture    Past Medical History:  Past Medical History  Diagnosis Date  . Hypertension   . Tobacco abuse    Past Surgical History:  Past Surgical History  Procedure Date  . External fixation pelvis 09/10/2011    Procedure: EXTERNAL FIXATION PELVIS;  Surgeon: Vania Rea Supple;  Location: [facility] OR;  Service: Orthopedics;  Laterality: N/A;  . Irrigation and debridement knee 09/10/2011    Procedure: IRRIGATION AND DEBRIDEMENT KNEE;  Surgeon: Vania Rea Supple;  Location: [facility] OR;  Service: Orthopedics;  Laterality: Left;    PT Assessment/Plan/Recommendation PT Plan PT Frequency: Min 2X/week PT Recommendation;  SNF for rehab Equipment Recommended: Defer to next venue PT Goals  Acute Rehab PT Goals PT Goal Formulation: With patient Time For Goal Achievement: 2 weeks Pt will Roll Supine to Right Side: with mod assist PT Goal: Rolling Supine to Right Side - Progress: Not met Pt will Roll Supine to Left Side: with mod assist PT Goal: Rolling Supine to Left Side - Progress: Not met Pt will go Supine/Side to Sit: with mod assist PT Goal: Supine/Side to Sit - Progress: Not met Pt will Sit at [facility] of Bed: with unilateral upper extremity support;3-5 min;with min assist PT Goal: Sit at Edge Of Bed - Progress: Not met Pt will Transfer Bed to Chair/Chair to Bed: with +2 total assist;Other (comment) (via sliding board or A/P transfers and pt= >30%) PT Transfer Goal: Bed to Chair/Chair to Bed - Progress: Not met  PT Evaluation Precautions/Restrictions  Restrictions Weight Bearing Restrictions: Yes RUE Weight Bearing: Non weight bearing RLE Weight Bearing: Non weight bearing LLE Weight Bearing: Non  weight bearing Prior Functioning  Home Living Lives With: Spouse Receives Help From: Family Type of Home: Mobile home Home Layout: One level;Able to live on main level with bedroom/bathroom Home Access: Ramped entrance Bathroom Shower/Tub: Tub/shower unit Bathroom Toilet: Standard Home Adaptive Equipment: None Prior Function Level of Independence: Independent with basic ADLs;Independent with homemaking with ambulation;Independent with homemaking with wheelchair;Independent with gait;Independent with transfers Able to Take Stairs?: Yes Driving: Yes Vocation: Full time employment Cognition Cognition Arousal/Alertness: Awake/alert Overall Cognitive Status: Appears within functional limits for tasks assessed Orientation Level: Oriented X4 Sensation/Coordination Coordination Gross Motor Movements are Fluid and Coordinated: Not tested Fine Motor Movements are Fluid and Coordinated: Not tested Extremity Assessment RLE Assessment RLE Assessment:  (unable to test due to fracture) LLE Assessment LLE Assessment:  (unable to test due to fracture) Mobility (including Balance) Bed Mobility Bed Mobility: Yes Rolling Right: 1: +2 Total assist;Patient percentage (comment) (pt<10%) Rolling Right Details (indicate cue type and reason): pt reached for rail with L hand Rolling Left: 1: +2 Total assist;Patient percentage (comment);Other (comment) (pt<10%) Transfers Transfers: No (used mechnical lift) Ambulation/Gait Ambulation/Gait: No Stairs: No Wheelchair Mobility Wheelchair Mobility: No  Posture/Postural Control Posture/Postural Control:  (not tested yet) Balance Balance Assessed: No Exercise  General Exercises - Lower Extremity Heel Slides: AAROM;15 reps;Both;Supine Hip ABduction/ADduction: AAROM;10 reps;Supine End of Session PT - End of Session Activity Tolerance: Patient tolerated treatment well Patient left: in chair;with call bell in reach;Other (comment) (on lift pad) Nurse  Communication: Mobility status for transfers General Behavior During Session: Flat affect Cognition: WFL for tasks performed  Anicka Stuckert, Eliseo Gum 09/17/2011, 10:54 AM

## 2011-09-17 NOTE — Progress Notes (Signed)
Occupational Therapy Evaluation Patient Details Name: Theodore Gonzalez MRN: [id number] DOB: May 02, 1956 Today's Date: 09/17/2011  Problem List:  Patient Active Problem List  Diagnoses  . Pelvic fracture  . Dislocation of right shoulder joint  . Dislocation of right ankle joint  . Closed right ankle fracture    Past Medical History:  Past Medical History  Diagnosis Date  . Hypertension   . Tobacco abuse    Past Surgical History:  Past Surgical History  Procedure Date  . External fixation pelvis 09/10/2011    Procedure: EXTERNAL FIXATION PELVIS;  Surgeon: Vania Rea Supple;  Location: [facility] OR;  Service: Orthopedics;  Laterality: N/A;  . Irrigation and debridement knee 09/10/2011    Procedure: IRRIGATION AND DEBRIDEMENT KNEE;  Surgeon: Vania Rea Supple;  Location: [facility] OR;  Service: Orthopedics;  Laterality: Left;    OT Assessment/Plan/Recommendation OT Assessment Clinical Impression Statement: Pt. will benefit from OT to increase functional independence with ADLs and derease burden of care at next venue of care OT Recommendation/Assessment: Patient will need skilled OT in the acute care venue OT Problem List: Decreased strength;Decreased activity tolerance;Impaired balance (sitting and/or standing);Decreased safety awareness;Decreased knowledge of use of DME or AE;Decreased knowledge of precautions;Impaired UE functional use Barriers to Discharge: Decreased caregiver support OT Therapy Diagnosis : Generalized weakness;Acute pain OT Plan OT Frequency: Min 2X/week OT Treatment/Interventions: Self-care/ADL training;DME and/or AE instruction;Therapeutic activities;Patient/family education;Balance training OT Recommendation Follow Up Recommendations: Skilled nursing facility Equipment Recommended: Defer to next venue Individuals Consulted Consulted and Agree with Results and Recommendations: Patient OT Goals Acute Rehab OT Goals OT Goal Formulation: With patient Time For Goal Achievement:  2 weeks ADL Goals Pt Will Perform Grooming: with set-up;with supervision;Unsupported;Sitting, edge of bed ADL Goal: Grooming - Progress: Progressing toward goals Pt Will Perform Upper Body Bathing: with min assist;Unsupported;Sitting, edge of bed ADL Goal: Upper Body Bathing - Progress: Not met Pt Will Perform Upper Body Dressing: with min assist;Unsupported;Sitting, bed ADL Goal: Upper Body Dressing - Progress: Progressing toward goals Pt Will Transfer to Toilet: with mod assist;with transfer board;Drop arm 3-in-1 ADL Goal: Toilet Transfer - Progress: Not met Arm Goals Additional Arm Goal #1: Pt. will complete pendulum exercises Rt. UE with min assist. Arm Goal: Additional Goal #1 - Progress: Not met  OT Evaluation Precautions/Restrictions  Precautions Precautions: Fall Required Braces or Orthoses: No Restrictions Weight Bearing Restrictions: Yes RUE Weight Bearing: Non weight bearing RLE Weight Bearing: Non weight bearing LLE Weight Bearing: Non weight bearing Prior Functioning Home Living Lives With: Spouse Receives Help From: Family Type of Home: Mobile home Home Layout: One level;Able to live on main level with bedroom/bathroom Home Access: Ramped entrance Bathroom Shower/Tub: Tub/shower unit Bathroom Toilet: Standard Home Adaptive Equipment: None Prior Function Level of Independence: Independent with basic ADLs;Independent with homemaking with ambulation;Independent with homemaking with wheelchair;Independent with gait;Independent with transfers Able to Take Stairs?: Yes Driving: Yes Vocation: Full time employment ADL ADL Eating/Feeding: Simulated;Set up Eating/Feeding Details (indicate cue type and reason): With use of lt. UE primarily Where Assessed - Eating/Feeding: Bed level Grooming: Performed;Wash/dry face;Set up Grooming Details (indicate cue type and reason): With Rt. UE Where Assessed - Grooming: Supine, head of bed up Upper Body Bathing:  Simulated;Chest;Right arm;Left arm;Abdomen;Maximal assistance Where Assessed - Upper Body Bathing: Supine, head of bed up Lower Body Bathing: Simulated;+1 Total assistance Where Assessed - Lower Body Bathing: Supine, head of bed up;Rolling right and/or left Upper Body Dressing: Simulated;Maximal assistance Upper Body Dressing Details (indicate cue type and reason): With donning  gown Where Assessed - Upper Body Dressing: Supine, head of bed up Lower Body Dressing: Simulated;+1 Total assistance Where Assessed - Lower Body Dressing: Sitting, chair Toilet Transfer: Performed;Other (comment) (With use of maxi sky lift to complete bed-recliner) Toilet Transfer Method: Other (comment) (Maxi sky) Toilet Transfer Equipment: Other (comment) (maxi sky) Toileting - Clothing Manipulation: Simulated;+1 Total assistance Toileting - Clothing Manipulation Details (indicate cue type and reason): With moving gown Where Assessed - Toileting Clothing Manipulation: Sit to stand from 3-in-1 or toilet Toileting - Hygiene: Performed;+1 Total assistance Toileting - Hygiene Details (indicate cue type and reason): Pt. incontinent of bowels upon arrival and unable to reach backside to complete Where Assessed - Toileting Hygiene: Rolling right and/or left Tub/Shower Transfer: Not assessed Equipment Used: Other (comment) (maxi sky) ADL Comments: Pt. educated on rt. shoulder precautions and AROM for elbow and distal which pt. completed 5 reps of elbow, wrist and digit flex/ext. Will continue to progress with shoulder pendulum exercises.  Vision/Perception  Vision - History Baseline Vision: Wears glasses only for reading Patient Visual Report: No change from baseline Vision - Assessment Eye Alignment: Within Functional Limits Vision Assessment: Vision not tested Cognition Cognition Arousal/Alertness: Awake/alert Overall Cognitive Status: Appears within functional limits for tasks assessed Orientation Level: Oriented  X4 Cognition - Other Comments: Pt. with flat affect Sensation/Coordination Sensation Light Touch: Not tested Stereognosis: Not tested Hot/Cold: Not tested Proprioception: Not tested Coordination Gross Motor Movements are Fluid and Coordinated: Not tested Fine Motor Movements are Fluid and Coordinated: Yes Extremity Assessment RUE Assessment RUE Assessment: Within Functional Limits (WFL distal of shoulder. shoulder not tested due to fx) LUE Assessment LUE Assessment: Within Functional Limits Mobility  Bed Mobility Bed Mobility: Yes Rolling Right: 1: +2 Total assist;Patient percentage (comment) (pt=<10%) Rolling Right Details (indicate cue type and reason): Max verbal cues for reaching with lt ue for rail use Rolling Left: 1: +2 Total assist;Patient percentage (comment);Other (comment) (pt=<10%) Transfers Transfers: Yes (bed-chair with maxi sky due to pt. bil LE NWB and Rt. UE NWB) Exercises   End of Session OT - End of Session Equipment Utilized During Treatment: Other (comment) (maxi sky) Activity Tolerance: Patient limited by pain Patient left: in chair;with call bell in reach Nurse Communication: Need for lift equipment General Behavior During Session: Flat affect Cognition: [facility] for tasks performed   Leeasia Secrist, OTR/L Pager [phone] 09/17/2011, 1:55 PM

## 2011-09-18 LAB — CLOSTRIDIUM DIFFICILE BY PCR: Toxigenic C. Difficile by PCR: POSITIVE — AB

## 2011-09-18 LAB — PROTIME-INR
INR: 2.57 — ABNORMAL HIGH (ref 0.00–1.49)
Prothrombin Time: 28 s — ABNORMAL HIGH (ref 11.6–15.2)

## 2011-09-18 LAB — CBC
HCT: 29.1 % — ABNORMAL LOW (ref 39.0–52.0)
Hemoglobin: 9.8 g/dL — ABNORMAL LOW (ref 13.0–17.0)
MCHC: 33.7 g/dL (ref 30.0–36.0)
RDW: 14 % (ref 11.5–15.5)
WBC: 8 10*3/uL (ref 4.0–10.5)

## 2011-09-18 NOTE — Progress Notes (Signed)
ANTICOAGULATION CONSULT NOTE - Initial Consult  Pharmacy Consult for Coumadin Indication: DVT px s/p ORIF pelvis/ rt ankle on 12/21  Assessment: 55yoM being managed on Coumadin for DVT prophylaxis s/p ORIF pelvis/ rt ankle on 12/21. Also receiving Lovenox bridge until INR >2. CBC low but stable (ABLA), no bleeding reported in notes; Patient started on flagyl 12/24 x 14 days for (+) C.Diff which is known to interact with coumadin--will monitor closely. INR therapeutic today (INR 2.57) but significant jump from yesterday (INR 1.20) after only 2 doses of coumadin.   Goal of Therapy:  INR 2-3   Plan:  1) Hold coumadin tonight 2) F/U daily INR 3) Discontinue lovenox  Theodore Gonzalez, PharmD     Pager [phone] 09/18/2011   1:55 PM    No Known Allergies  Patient Measurements: Height: 5\' 8"  (172.7 cm) Weight: 231 lb 14.8 oz (105.2 kg) IBW/kg (Calculated) : 68.4   Vital Signs: Temp: 98.4 F (36.9 C) (12/25 1137) Temp src: Oral (12/25 1137) BP: 120/66 mmHg (12/25 1100) Pulse Rate: 102  (12/25 1100)  Labs:  Basename 09/18/11 0315 09/17/11 0153 09/17/11 0132 09/16/11 0515  HGB 9.8* 10.5* -- --  HCT 29.1* 30.6* -- 29.0*  PLT 271 271 -- 209  APTT -- -- -- --  LABPROT 28.0* 15.5* -- --  INR 2.57* 1.20 -- --  HEPARINUNFRC -- -- -- --  CREATININE -- -- -- --  CKTOTAL -- -- 495* --  CKMB -- -- 1.7 --  TROPONINI -- -- <0.30 --   Estimated Creatinine Clearance: 121.2 ml/min (by C-G formula based on Cr of 0.64).  Medical History: Past Medical History  Diagnosis Date  . Hypertension   . Tobacco abuse    Medications:  Scheduled:     . amLODipine  5 mg Oral Daily  . enoxaparin (LOVENOX) injection  30 mg Subcutaneous Q12H  . hydrochlorothiazide  25 mg Oral Daily   And  . olmesartan  20 mg Oral Daily  . HYDROmorphone PCA 0.3 mg/mL   Intravenous Q4H  . metroNIDAZOLE  500 mg Oral Q8H  . pantoprazole  40 mg Oral Q1200  . warfarin  7.5 mg Oral ONCE-1800  . warfarin   Does not  apply Once

## 2011-09-18 NOTE — Progress Notes (Signed)
Pt. Received from 2300 via bed. Complete max assist transfer. Family and pt. Settled and oriented to unit
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
## 2011-09-19 LAB — PROTIME-INR
INR: 3.73 — ABNORMAL HIGH (ref 0.00–1.49)
Prothrombin Time: 37.5 s — ABNORMAL HIGH (ref 11.6–15.2)

## 2011-09-19 NOTE — Progress Notes (Signed)
Physical Therapy Treatment Patient Details Name: Theodore Gonzalez MRN: [id number] DOB: 1956-06-08 Today's Date: 09/19/2011  PT Assessment/Plan  PT - Assessment/Plan Comments on Treatment Session: Pt progressing.  Limited by precautions of NWBing, but very motivated.  Pt very appreciative and thankful to be moving around.  Co-treatment with OT. PT Plan: Discharge plan remains appropriate;Frequency remains appropriate PT Frequency: Min 2X/week Follow Up Recommendations: Skilled nursing facility Equipment Recommended: Defer to next venue PT Goals  Acute Rehab PT Goals PT Goal Formulation: With patient Time For Goal Achievement: 2 weeks PT Goal: Supine/Side to Sit - Progress: Progressing toward goal PT Goal: Sit at Edge Of Bed - Progress: Progressing toward goal PT Transfer Goal: Bed to Chair/Chair to Bed - Progress: Progressing toward goal  PT Treatment Precautions/Restrictions  Precautions Precautions: Fall Required Braces or Orthoses: No Restrictions Weight Bearing Restrictions: Yes RUE Weight Bearing: Non weight bearing RLE Weight Bearing: Non weight bearing LLE Weight Bearing: Non weight bearing Other Position/Activity Restrictions: Right UE sling; Bilateral knee and left ankle ROM as tolerated. Pain 3/10 throughout body.  Pt repositioned and with PCA pump. Mobility (including Balance) Bed Mobility Bed Mobility: Yes Rolling Right: Not tested (comment) Rolling Left: Not tested (comment) Supine to Sit: 1: +2 Total assist;Patient percentage (comment);HOB elevated (Comment degrees) ((pt=10%); HOB 45 degrees.) Supine to Sit Details (indicate cue type and reason): Assist to bilateral LEs and trunk to shift anterior over BOS and scoot around to EOB.  Cues for sequence. Transfers Transfers: Yes Anterior-Posterior Transfer: 1: +2 Total assist;Patient percentage (comment);To level surface ((pt=10%)) Anterior-Posterior Transfer Details (indicate cue type and reason): Assist for trunk to  weight shift and advance hips reciprocally backwards with cues to use only left UE to assist.  Pt requiring assist to move bilateral LEs backwards and keep trunk tilted anterior to midline. Ambulation/Gait Ambulation/Gait: No Stairs: No Wheelchair Mobility Wheelchair Mobility: No  Posture/Postural Control Posture/Postural Control: No significant limitations Balance Balance Assessed: Yes Static Sitting Balance Static Sitting - Balance Support: Left upper extremity supported;Feet supported Static Sitting - Level of Assistance: 1: +2 Total assist;Patient percentage (comment) ((pt=50%)) Static Sitting - Comment/# of Minutes: 3 (Assist to trunk to keep shifted anterior over BOS.) Exercise  General Exercises - Lower Extremity Ankle Circles/Pumps: AAROM;Left;Supine Heel Slides: AAROM;Both;10 reps;Supine Shoulder Exercises Pendulum Exercise: Seated;Right Elbow Flexion: AROM;10 reps;Supine Elbow Extension: 10 reps;AROM;Supine Wrist Flexion: AROM;5 reps;Supine Wrist Extension: AROM;5 reps;Supine Digit Composite Flexion: AROM;5 reps;Supine Composite Extension: AROM;5 reps;Supine End of Session PT - End of Session Activity Tolerance: Patient tolerated treatment well Patient left: in chair;with call bell in reach Nurse Communication: Mobility status for transfers General Behavior During Session: [facility] for tasks performed Cognition: [facility] for tasks performed  Cephus Shelling 09/19/2011, 10:54 AM  09/19/2011 Cephus Shelling, PT, DPT [phone]
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
## 2011-09-19 NOTE — Progress Notes (Signed)
Occupational Therapy Treatment Patient Details Name: Theodore Gonzalez MRN: [id number] DOB: 1955/12/06 Today's Date: 09/19/2011  OT Assessment/Plan OT Assessment/Plan OT Plan: Discharge plan remains appropriate OT Frequency: Min 2X/week Follow Up Recommendations: Skilled nursing facility Equipment Recommended: Defer to next venue OT Goals Acute Rehab OT Goals OT Goal Formulation: With patient Time For Goal Achievement: 2 weeks ADL Goals Pt Will Transfer to Toilet: with mod assist;with transfer board;Drop arm 3-in-1 ADL Goal: Toilet Transfer - Progress: Progressing toward goals Arm Goals Additional Arm Goal #1: Pt. will complete pendulum exercises Rt. UE with min assist. Arm Goal: Additional Goal #1 - Progress: Progressing toward goals  OT Treatment Precautions/Restrictions  Precautions Precautions: Fall Required Braces or Orthoses: No Restrictions Weight Bearing Restrictions: Yes RUE Weight Bearing: Non weight bearing RLE Weight Bearing: Non weight bearing LLE Weight Bearing: Non weight bearing Other Position/Activity Restrictions: Right UE sling; Bilateral knee and left ankle ROM as tolerated.   ADL ADL Toilet Transfer: Simulated;+2 Total assistance;Comment for patient %;Other (comment) (A-P transfer to chair pt 10%) Mobility  Bed Mobility Bed Mobility: Yes Exercises Shoulder Exercises Pendulum Exercise: Seated;Right Elbow Flexion: AROM;10 reps;Supine Elbow Extension: 10 reps;AROM;Supine Wrist Flexion: AROM;5 reps;Supine Wrist Extension: AROM;5 reps;Supine Digit Composite Flexion: AROM;5 reps;Supine Composite Extension: AROM;5 reps;Supine  End of Session OT - End of Session Activity Tolerance: Patient tolerated treatment well Patient left: in chair;with call bell in reach Nurse Communication: Need for lift equipment  Evern Bio  09/19/2011, 10:52 AM [phone]

## 2011-09-19 NOTE — Progress Notes (Signed)
ABD distended Continue treatment for c-diff Jonise Weightman E
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
## 2011-09-20 ENCOUNTER — Encounter (HOSPITAL_COMMUNITY): Payer: Self-pay | Admitting: Orthopedic Surgery

## 2011-09-20 LAB — BASIC METABOLIC PANEL
BUN: 16 mg/dL (ref 6–23)
Creatinine, Ser: 0.56 mg/dL (ref 0.50–1.35)
GFR calc Af Amer: >90 mL/min (ref >90)
GFR calc non Af Amer: >90 mL/min (ref >90)

## 2011-09-20 LAB — CBC
HCT: 30.8 % — ABNORMAL LOW (ref 39.0–52.0)
MCHC: 33.1 g/dL (ref 30.0–36.0)
MCV: 88.5 fL (ref 78.0–100.0)
Platelets: 354 10*3/uL (ref 150–400)
RDW: 14.2 % (ref 11.5–15.5)
WBC: 8.2 10*3/uL (ref 4.0–10.5)

## 2011-09-20 LAB — PROTIME-INR: INR: 3.72 — ABNORMAL HIGH (ref 0.00–1.49)

## 2011-09-20 NOTE — Anesthesia Postprocedure Evaluation (Signed)
Case Cancelled
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
## 2011-09-20 NOTE — Transfer of Care (Signed)
Case Cancelled

## 2011-09-20 NOTE — Consult Note (Signed)
I have seen and examined the patient. I agree with the findings above.  Theodore Gonzalez 09/20/2011 1:06 PM
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
## 2011-09-20 NOTE — Progress Notes (Signed)
UR of chart updated. Clinical update faxed to WC CM. 

## 2011-09-21 LAB — PROTIME-INR: INR: 3.68 — ABNORMAL HIGH (ref 0.00–1.49)

## 2011-09-21 MED ORDER — OLMESARTAN MEDOXOMIL 20 MG PO TABS
20.0000 mg | ORAL_TABLET | Freq: Every day | ORAL | Status: DC
  Administered 2011-09-22 – 2011-09-27 (×3): 20 mg via ORAL
  Filled 2011-09-21 (×6): qty 1

## 2011-09-21 MED ORDER — HYDROCHLOROTHIAZIDE 25 MG PO TABS
25.0000 mg | ORAL_TABLET | Freq: Every day | ORAL | Status: DC
  Administered 2011-09-22 – 2011-09-27 (×3): 25 mg via ORAL
  Filled 2011-09-21 (×6): qty 1

## 2011-09-21 NOTE — Progress Notes (Signed)
PT refuses CPAP at this time. PT says he has not been wearing one and that he has been sleeping just fine. RT told PT to call if he changes his mind and feels like he needs it. RT will continue to monitor.

## 2011-09-21 NOTE — Progress Notes (Signed)
Clinical Social Worker continuing to follow patient for discharge planning needs.  CSW provided patient and patient daughter with bed offers.  Patient daughter is to go look at [facility] for placement and notify CSW after her visit.  Clinical Social Worker stressed the importance to patient daughter about having patient discharge plan in place as soon as possible.  Patient daughter understands and is agreeable.  CSW will facilitate patient discharge needs once patient family decides on facility and patient is medically stable.  [address] [place], [place] [phone]
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
## 2011-09-21 NOTE — Progress Notes (Signed)
Patient daughter notified CSW that they have chosen [facility] for placement at discharge.  CSW contacted Worker's Compensation who have been in touch with facility.  Clinical Social Worker spoke with facility representative who states she will contact adjustor to negotiate a rate and plan for patient admission Monday or Wednesday.  MD notified of discharge plan.  CSW to facilitate patient discharge needs once medically stable.  [address] [place], [place] [phone]

## 2011-09-22 DIAGNOSIS — A0472 Enterocolitis due to Clostridium difficile, not specified as recurrent: Secondary | ICD-10-CM | POA: Diagnosis not present

## 2011-09-22 LAB — PROTIME-INR
INR: 3.32 — ABNORMAL HIGH (ref 0.00–1.49)
Prothrombin Time: 34.2 s — ABNORMAL HIGH (ref 11.6–15.2)

## 2011-09-22 MED ORDER — OXYCODONE HCL 5 MG PO TABS
5.0000 mg | ORAL_TABLET | ORAL | Status: DC | PRN
  Administered 2011-09-22: 5 mg via ORAL
  Filled 2011-09-22: qty 1
  Filled 2011-09-22: qty 2
  Filled 2011-09-22 (×2): qty 3

## 2011-09-22 MED ORDER — HYDROMORPHONE HCL PF 1 MG/ML IJ SOLN
0.5000 mg | INTRAMUSCULAR | Status: DC | PRN
  Administered 2011-09-22 – 2011-09-24 (×9): 0.5 mg via INTRAVENOUS
  Filled 2011-09-22 (×9): qty 1

## 2011-09-22 MED ORDER — OXYCODONE HCL 5 MG PO TABS
15.0000 mg | ORAL_TABLET | ORAL | Status: DC | PRN
  Administered 2011-09-22: 20 mg via ORAL
  Administered 2011-09-22 – 2011-09-23 (×2): 15 mg via ORAL
  Administered 2011-09-23: 20 mg via ORAL
  Administered 2011-09-23: 15 mg via ORAL
  Filled 2011-09-22: qty 4
  Filled 2011-09-22: qty 3
  Filled 2011-09-22: qty 4
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
## 2011-09-22 NOTE — Progress Notes (Addendum)
Patient ID: HAYDAN MANSOURI, male   DOB: 09-05-1956, 55 y.o.   MRN: [id number] PATIENT ID: Theodore Gonzalez  MRN: [id number]  DOB/AGE:  1956/06/16 / 55 y.o.  8 Days Post-Op Procedure(s) (LRB): OPEN REDUCTION INTERNAL FIXATION (ORIF) PELVIC FRACTURE (N/A) OPEN REDUCTION INTERNAL FIXATION (ORIF) ANKLE FRACTURE (Right)    PROGRESS NOTE Subjective: Patient is alert, oriented, no Nausea, no Vomiting, yes passing gas, yes Bowel Movement. Taking PO well. Denies SOB, Chest or Calf Pain. Using Incentive Spirometer,  Ambulate bed to chair transfers,  NWB bilateral LE and R upper extremities,  Patient reports pain as 2 on 0-10 scale  .    Objective: Vital signs in last 24 hours: Filed Vitals:   09/22/11 0232 09/22/11 0449 09/22/11 0607 09/22/11 0819  BP: 100/66  99/67   Pulse: 103  105   Temp: 98.2 F (36.8 C)  97.8 F (36.6 C)   TempSrc:      Resp: 20 18 20 18   Height:      Weight:      SpO2: 97% 97% 96% 97%      Intake/Output from previous day: I/O last 3 completed shifts: In: 1470 [P.O.:870; I.V.:600] Out: 3050 [Urine:2600; Stool:450]   Intake/Output this shift:     LABORATORY DATA:  Basename 09/22/11 0500 09/21/11 0518 09/20/11 0355  WBC -- -- 8.2  HGB -- -- 10.2*  HCT -- -- 30.8*  PLT -- -- 354  NA -- -- 134*  K -- -- 3.6  CL -- -- 97  CO2 -- -- 29  BUN -- -- 16  CREATININE -- -- 0.56  GLUCOSE -- -- 121*  GLUCAP -- -- --  INR 3.32* 3.68* --  CALCIUM -- -- 8.3*    Examination: ABD soft Neurovascular intact Sensation intact distally Intact pulses distally Dorsiflexion/Plantar flexion intact Incision: dressing C/D/I} Right arm in sling, bilaterally equal grip strength.  Assessment:   8 Days Post-Op Procedure(s) (LRB): OPEN REDUCTION INTERNAL FIXATION (ORIF) PELVIC FRACTURE (N/A) OPEN REDUCTION INTERNAL FIXATION (ORIF) ANKLE FRACTURE (Right) ADDITIONAL DIAGNOSIS:  Acute Blood Loss Anemia, Hypertension and right shoulder dislocation Clostridium difficile.  Plan:     PT/OT  NWB bilateral LE and R upper extremities,  DVT Prophylaxis: coumadin  INR therapeutic today D/C PCA     Alphonse Asbridge J 09/22/2011, 9:44 AM

## 2011-09-22 NOTE — Progress Notes (Signed)
RT Note: Pt refused to wear CPAP tonight. 
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
## 2011-09-22 NOTE — Progress Notes (Signed)
I have seen and examined the patient and agree with the assessment and plans.  Sabrinna Yearwood A. Krissi Willaims  MD, FACS  

## 2011-09-23 LAB — PROTIME-INR
INR: 2.44 — ABNORMAL HIGH (ref 0.00–1.49)
Prothrombin Time: 26.9 s — ABNORMAL HIGH (ref 11.6–15.2)

## 2011-09-23 MED ORDER — COUMADIN BOOK
Freq: Once | Status: AC
  Administered 2011-09-23: 17:00:00
  Filled 2011-09-23: qty 1

## 2011-09-23 MED ORDER — WARFARIN VIDEO: Freq: Once | Status: DC

## 2011-09-23 MED ORDER — OXYCODONE HCL 5 MG PO TABS
10.0000 mg | ORAL_TABLET | ORAL | Status: DC | PRN
  Administered 2011-09-24: 10 mg via ORAL
  Filled 2011-09-23: qty 4
  Filled 2011-09-23: qty 2

## 2011-09-23 MED ORDER — WARFARIN SODIUM 5 MG PO TABS
5.0000 mg | ORAL_TABLET | Freq: Once | ORAL | Status: AC
  Administered 2011-09-23: 5 mg via ORAL
  Filled 2011-09-23: qty 1

## 2011-09-23 MED ORDER — OXYCODONE HCL 5 MG PO TABS
20.0000 mg | ORAL_TABLET | ORAL | Status: DC | PRN
  Administered 2011-09-23: 20 mg via ORAL
  Administered 2011-09-23: 30 mg via ORAL
  Administered 2011-09-23 – 2011-09-24 (×3): 20 mg via ORAL
  Filled 2011-09-23: qty 6
  Filled 2011-09-23 (×3): qty 4

## 2011-09-23 NOTE — Progress Notes (Signed)
I saw the patient, participated in the history, exam and medical decision making, and concur with the physician assistant's note above.  Leena Tiede M. Altariq Goodall, MD, FACS General, Bariatric, & Minimally Invasive Surgery [facility]   

## 2011-09-23 NOTE — Progress Notes (Signed)
ANTICOAGULATION CONSULT NOTE - Follow Up Consult  Pharmacy Consult for coumadin Indication: VTE prophylaxis  No Known Allergies  Patient Measurements:   Vital Signs: Temp: 98.7 F (37.1 C) (12/30 0716) BP: 120/75 mmHg (12/30 0716) Pulse Rate: 95  (12/30 0716)  Labs:  Basename 09/23/11 0555 09/22/11 0500 09/21/11 0518  HGB -- -- --  HCT -- -- --  PLT -- -- --  APTT -- -- --  LABPROT 26.9* 34.2* 37.1*  INR 2.44* 3.32* 3.68*  HEPARINUNFRC -- -- --  CREATININE -- -- --  CKTOTAL -- -- --  CKMB -- -- --  TROPONINI -- -- --   Estimated Creatinine Clearance: 119.6 ml/min (by C-G formula based on Cr of 0.56).   Medications:  Scheduled:    . amLODipine  5 mg Oral Daily  . olmesartan  20 mg Oral Daily   And  . hydrochlorothiazide  25 mg Oral Daily  . metroNIDAZOLE  500 mg Oral Q8H  . pantoprazole  40 mg Oral Q1200  . warfarin  5 mg Oral ONCE-1800    Assessment: Coumadin has been held for several days due to supratherapeutic INR. INR is now 2.44 so will resume coumadin at a lower dose due to probably drug interaction with metronidazole. Goal of Therapy:  INR 2-3   Plan:  Will give 5 mg coumadin today and f/u INR for further dosing.  Eugene Garnet 09/23/2011,11:13 AM
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
## 2011-09-24 DIAGNOSIS — I10 Essential (primary) hypertension: Secondary | ICD-10-CM | POA: Insufficient documentation

## 2011-09-24 LAB — PROTIME-INR: Prothrombin Time: 27.8 s — ABNORMAL HIGH (ref 11.6–15.2)

## 2011-09-24 MED ORDER — OXYCODONE HCL 5 MG PO TABS
10.0000 mg | ORAL_TABLET | ORAL | Status: DC | PRN
  Administered 2011-09-25 – 2011-09-26 (×3): 10 mg via ORAL
  Filled 2011-09-24 (×2): qty 2

## 2011-09-24 MED ORDER — WHITE PETROLATUM GEL
Status: AC
  Administered 2011-09-24: 01:00:00
  Filled 2011-09-24: qty 5

## 2011-09-24 MED ORDER — METHOCARBAMOL 500 MG PO TABS
500.0000 mg | ORAL_TABLET | Freq: Four times a day (QID) | ORAL | Status: DC | PRN
  Administered 2011-09-24: 1000 mg via ORAL
  Administered 2011-09-25 – 2011-09-26 (×2): 500 mg via ORAL
  Administered 2011-09-27 (×2): 1000 mg via ORAL
  Filled 2011-09-24 (×3): qty 2
  Filled 2011-09-24: qty 1
  Filled 2011-09-24: qty 2

## 2011-09-24 MED ORDER — HYDROMORPHONE HCL PF 1 MG/ML IJ SOLN
0.5000 mg | INTRAMUSCULAR | Status: DC | PRN
Start: 2011-09-24 — End: 2011-09-27
  Administered 2011-09-24 – 2011-09-25 (×2): 0.5 mg via INTRAVENOUS
  Filled 2011-09-24 (×2): qty 1

## 2011-09-24 MED ORDER — OXYCODONE HCL 5 MG PO TABS
20.0000 mg | ORAL_TABLET | ORAL | Status: DC | PRN
  Administered 2011-09-24 – 2011-09-26 (×7): 20 mg via ORAL
  Administered 2011-09-26: 5 mg via ORAL
  Filled 2011-09-24 (×10): qty 4

## 2011-09-24 MED ORDER — TRAMADOL HCL 50 MG PO TABS
100.0000 mg | ORAL_TABLET | Freq: Four times a day (QID) | ORAL | Status: DC
  Administered 2011-09-24 – 2011-09-27 (×12): 100 mg via ORAL
  Filled 2011-09-24 (×2): qty 2
  Filled 2011-09-24: qty 1
  Filled 2011-09-24 (×7): qty 2
  Filled 2011-09-24: qty 1
  Filled 2011-09-24 (×2): qty 2

## 2011-09-24 MED ORDER — WARFARIN SODIUM 5 MG PO TABS
5.0000 mg | ORAL_TABLET | Freq: Once | ORAL | Status: AC
  Administered 2011-09-24: 5 mg via ORAL
  Filled 2011-09-24: qty 1

## 2011-09-24 MED ORDER — OXYCODONE HCL 5 MG PO TABS
15.0000 mg | ORAL_TABLET | ORAL | Status: DC | PRN
  Administered 2011-09-26: 20 mg via ORAL
  Administered 2011-09-26 – 2011-09-27 (×4): 15 mg via ORAL
  Filled 2011-09-24 (×3): qty 3

## 2011-09-24 MED ORDER — OXYCODONE HCL 5 MG PO TABS: 10.0000 mg | ORAL_TABLET | ORAL | Status: DC | PRN

## 2011-09-24 NOTE — Progress Notes (Signed)
This patient has been seen and I agree with the findings and treatment plan.  Adlai Nieblas O. Jewelle Whitner, III, MD, FACS [phone] (pager) [phone] (direct pager) Trauma Surgeon  

## 2011-09-24 NOTE — Progress Notes (Signed)
ANTICOAGULATION CONSULT NOTE - Follow Up Consult  Pharmacy Consult for Coumadin Indication: VTE prophylaxis  No Known Allergies  Patient Measurements: Height: 5\' 8"  (172.7 cm) Weight: 225 lb 15.5 oz (102.5 kg) IBW/kg (Calculated) : 68.4   Vital Signs: Temp: 98.6 F (37 C) (12/31 0700) Temp src: Oral (12/30 2349) BP: 104/67 mmHg (12/31 0828) Pulse Rate: 95  (12/31 0828)  Labs:  Alvira Philips 09/24/11 0102 09/23/11 0555 09/22/11 0500  HGB -- -- --  HCT -- -- --  PLT -- -- --  APTT -- -- --  LABPROT 27.8* 26.9* 34.2*  INR 2.54* 2.44* 3.32*  HEPARINUNFRC -- -- --  CREATININE -- -- --  CKTOTAL -- -- --  CKMB -- -- --  TROPONINI -- -- --   Estimated Creatinine Clearance: 119.6 ml/min (by C-G formula based on Cr of 0.56).   Medications:  Scheduled:    . amLODipine  5 mg Oral Daily  . coumadin book   Does not apply Once  . olmesartan  20 mg Oral Daily   And  . hydrochlorothiazide  25 mg Oral Daily  . metroNIDAZOLE  500 mg Oral Q8H  . pantoprazole  40 mg Oral Q1200  . warfarin  5 mg Oral ONCE-1800  . warfarin   Does not apply Once  . white petrolatum        Assessment: 55 y/o male patient receiving coumadin for dvt px. INR therapeutic. Noted drug interaction with Flagyl. No bleeding reported. INR beginning to stabilize.  Goal of Therapy:  INR 2-3   Plan:  Repeat coumadin 5mg  today and f/u in am. Verlene Mayer, PharmD, BCPS Pager [phone] 09/24/2011,9:52 AM

## 2011-09-24 NOTE — Progress Notes (Signed)
Occupational Therapy Progress Note Patient Details Name: Theodore Gonzalez MRN: [id number] DOB: [birth year] Today's Date: 09/24/2011 Time:1100 - 1130 30 min  OT Assessment/Plan OT Assessment/Plan Comments on Treatment Session: Pt with good participation. OT Plan: Discharge plan needs to be updated OT Frequency: Min 1X/week Equipment Recommended: Defer to next venue OT Goals Acute Rehab OT Goals OT Goal Formulation: With patient/family ADL Goals Pt Will Perform Grooming: with set-up;with supervision;Unsupported;Sitting, edge of bed ADL Goal: Grooming - Progress: Progressing toward goals ADL Goal: Upper Body Bathing - Progress: Progressing toward goals Pt Will Perform Upper Body Dressing: with min assist;Unsupported;Sitting, bed ADL Goal: Upper Body Dressing - Progress: Progressing toward goals Pt Will Transfer to Toilet: with mod assist;with transfer board;Drop arm 3-in-1 ADL Goal: Toilet Transfer - Progress: Not addressed Arm Goals Additional Arm Goal #1: Pt. will complete pendulum exercises Rt. UE with min assist. Arm Goal: Additional Goal #1 - Progress: Progressing toward goals  OT Treatment  Focus of session on bed mobility, precautions and WBS and R A/AAROM R elbow and wrist/hand and pendulam R shoulder in sling. Precautions/Restrictions  Precautions Precaution Comments:  (fall) Restrictions Weight Bearing Restrictions: Yes RUE Weight Bearing: Non weight bearing RLE Weight Bearing: Non weight bearing LLE Weight Bearing: Non weight bearing Other Position/Activity Restrictions: Right UE sling; Bilateral knee and left ankle ROM as tolerated.   ADL ADL Eating/Feeding: Simulated;Set up Eating/Feeding Details (indicate cue type and reason): With use of lt. UE primarily Where Assessed - Eating/Feeding: Bed level Grooming: Performed;Wash/dry face;Set up Grooming Details (indicate cue type and reason): with LUE Where Assessed - Grooming: Sitting, bed Upper Body Bathing: Right  arm;Moderate assistance Where Assessed - Upper Body Bathing: Sitting, bed Lower Body Bathing: Not assessed Upper Body Dressing: Moderate assistance Where Assessed - Upper Body Dressing: Sitting, bed Lower Body Dressing: Not assessed Toilet Transfer: Not assessed Where Assessed - Toileting Clothing Manipulation: Not assessed Toileting - Hygiene: Not assessed Tub/Shower Transfer: Not assessed ADL Comments: educated on need to keep R shoulder immobilized during ADL. R shoulder sling adjusted. Mobility  Bed Mobility Bed Mobility: Yes Rolling Right: Not tested (comment) Rolling Left: 2: Max assist Supine to Sit: 2: Max assist Exercises Shoulder Exercises Pendulum Exercise: Right (in sling) Elbow Flexion: A/AAROM Elbow Extension: AROM Wrist Flexion: AROM Wrist Extension: AROM Digit Composite Flexion: AROM  End of Session OT - End of Session Activity Tolerance: Patient limited by pain;Patient limited by fatigue Patient left: in bed;with call bell in reach Nurse Communication: Weight bearing status General Behavior During Session: [facility] for tasks performed Cognition: [facility] for tasks performed  [facility]  09/24/2011, 1:59 PM [phone]
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
## 2011-09-24 NOTE — Progress Notes (Signed)
09/24/11 11:00 Text to Zack with social work to let him know of need for air overlay mattress at SNF per MD order. Jim Like RN BSN CCM

## 2011-09-24 NOTE — Progress Notes (Signed)
Physical Therapy Treatment Patient Details Name: STANFORD STRAUCH MRN: [id number] DOB: 06-02-1956 Today's Date: 09/24/2011  PT Assessment/Plan  PT - Assessment/Plan Comments on Treatment Session: Pt progressing.  Limited by precautions of [place]ing, but very motivated.  Pt very appreciative and thankful to be moving around.  PT Plan: Discharge plan remains appropriate;Frequency remains appropriate PT Frequency: Min 2X/week Follow Up Recommendations: Skilled nursing facility Equipment Recommended: Defer to next venue PT Goals  Acute Rehab PT Goals Time For Goal Achievement: 2 weeks Pt will Roll Supine to Right Side: with mod assist PT Goal: Rolling Supine to Right Side - Progress: Other (comment) Pt will Roll Supine to Left Side: with mod assist PT Goal: Rolling Supine to Left Side - Progress: Other (comment) Pt will go Supine/Side to Sit: with mod assist PT Goal: Supine/Side to Sit - Progress: Progressing toward goal Pt will Sit at [facility] of Bed: with unilateral upper extremity support;3-5 min;with min assist PT Goal: Sit at Edge Of Bed - Progress: Other (comment) Pt will Transfer Bed to Chair/Chair to Bed: with +2 total assist;Other (comment) PT Transfer Goal: Bed to Chair/Chair to Bed - Progress: Progressing toward goal  PT Treatment Precautions/Restrictions  Precautions Precautions: Fall Precaution Comments:  (fall) Required Braces or Orthoses: No Restrictions Weight Bearing Restrictions: Yes RUE Weight Bearing: Non weight bearing RLE Weight Bearing: Non weight bearing LLE Weight Bearing: Non weight bearing Other Position/Activity Restrictions: Right UE sling; Bilateral knee and left ankle ROM as tolerated. Mobility (including Balance) Bed Mobility Bed Mobility: Yes Rolling Right: Not tested (comment) Rolling Left: 2: Max assist Supine to Sit: 2: Max assist Supine to Sit Details (indicate cue type and reason): assist to move bil LEs, and rotate body and trunk while coming to  sit in prep for antpost transfer Transfers Anterior-Posterior Transfer: 1: +2 Total assist;Patient percentage (comment) (pt=10%) Anterior-Posterior Transfer Details (indicate cue type and reason): requiring assist to keep sitting upright during transfer and use only LUE for support Ambulation/Gait Ambulation/Gait: No Stairs: No Wheelchair Mobility Wheelchair Mobility: No  Posture/Postural Control Posture/Postural Control: No significant limitations Exercise  General Exercises - Lower Extremity Ankle Circles/Pumps: AAROM;Left;Supine Quad Sets: Right;Left;AROM;Seated (reclined) Heel Slides: AAROM;Both;10 reps;Seated (reclined)  End of Session PT - End of Session Equipment Utilized During Treatment:  (drop-arm recliner) Activity Tolerance: Patient tolerated treatment well (but painful) Patient left: in chair;with call bell in reach Nurse Communication: Mobility status for transfers General Behavior During Session: [facility] for tasks performed Cognition: [facility] for tasks performed  Van Clines [facility] [place], [place] [phone]  09/24/2011, 4:06 PM
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
## 2011-09-24 NOTE — Progress Notes (Signed)
Pt refuses CPAP 

## 2011-09-24 NOTE — Progress Notes (Signed)
Patient's worker's comp representative has not finalized an agreement with [employer], therefore Theodore Gonzalez could not be transferred today.  Gretta Cool, LCSW Assistant Director Clinical Social Work.

## 2011-09-25 LAB — PROTIME-INR: INR: 3.86 — ABNORMAL HIGH (ref 0.00–1.49)

## 2011-09-25 NOTE — Progress Notes (Signed)
Agree with above 

## 2011-09-25 NOTE — Progress Notes (Signed)
ANTICOAGULATION CONSULT NOTE - Follow Up Consult  Pharmacy Consult for Coumadin Indication: VTE prophylaxis  No Known Allergies  Patient Measurements: Height: 5\' 8"  (172.7 cm) Weight: 225 lb 15.5 oz (102.5 kg) IBW/kg (Calculated) : 68.4    Vital Signs: Temp: 98.3 F (36.8 C) (01/01 0545) BP: 95/63 mmHg (01/01 0545) Pulse Rate: 89  (01/01 0545)  Labs:  Basename 09/25/11 0555 09/24/11 0635 09/23/11 0555  HGB -- -- --  HCT -- -- --  PLT -- -- --  APTT -- -- --  LABPROT 38.5* 27.8* 26.9*  INR 3.86* 2.54* 2.44*  HEPARINUNFRC -- -- --  CREATININE -- -- --  CKTOTAL -- -- --  CKMB -- -- --  TROPONINI -- -- --   Estimated Creatinine Clearance: 119.6 ml/min (by C-G formula based on Cr of 0.56).   Medications:  Scheduled:    . amLODipine  5 mg Oral Daily  . olmesartan  20 mg Oral Daily   And  . hydrochlorothiazide  25 mg Oral Daily  . metroNIDAZOLE  500 mg Oral Q8H  . traMADol  100 mg Oral Q6H  . warfarin  5 mg Oral ONCE-1800  . warfarin   Does not apply Once  . DISCONTD: pantoprazole  40 mg Oral Q1200    Assessment: 56 y/o male patient receiving coumadin for DVT px s/p multiple fractures. INR supratherapeutic, no bleeding reported. Noted drug interaction with coumadin. Patient will need lower dose coumadin while on Flagyl.  Goal of Therapy:  INR 2-3   Plan:  No coumadin today. Follow up in am.  Verlene Mayer, PharmD, BCPS Pager [phone] 09/25/2011,9:30 AM
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
## 2011-09-26 ENCOUNTER — Inpatient Hospital Stay (HOSPITAL_COMMUNITY): Payer: Worker's Compensation

## 2011-09-26 LAB — COMPREHENSIVE METABOLIC PANEL
ALT: 33 U/L (ref 0–53)
Alkaline Phosphatase: 95 U/L (ref 39–117)
BUN: 14 mg/dL (ref 6–23)
CO2: 27 mEq/L (ref 19–32)
Chloride: 96 mEq/L (ref 96–112)
GFR calc Af Amer: >90 mL/min (ref >90)
GFR calc non Af Amer: >90 mL/min (ref >90)
Glucose, Bld: 97 mg/dL (ref 70–99)
Potassium: 4.2 mEq/L (ref 3.5–5.1)
Sodium: 131 mEq/L — ABNORMAL LOW (ref 135–145)
Total Bilirubin: 1 mg/dL (ref 0.3–1.2)
Total Protein: 6.6 g/dL (ref 6.0–8.3)

## 2011-09-26 LAB — CBC
HCT: 34.8 % — ABNORMAL LOW (ref 39.0–52.0)
Hemoglobin: 11.2 g/dL — ABNORMAL LOW (ref 13.0–17.0)
MCHC: 32.2 g/dL (ref 30.0–36.0)
RBC: 3.92 MIL/uL — ABNORMAL LOW (ref 4.22–5.81)

## 2011-09-26 LAB — MAGNESIUM: Magnesium: 2.1 mg/dL (ref 1.5–2.5)

## 2011-09-26 LAB — PROTIME-INR
INR: 3.75 — ABNORMAL HIGH (ref 0.00–1.49)
Prothrombin Time: 37.6 s — ABNORMAL HIGH (ref 11.6–15.2)
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
## 2011-09-26 NOTE — Progress Notes (Signed)
UR of chart completed. Await WC to complete process for pt to transfer to SNF.

## 2011-09-26 NOTE — Progress Notes (Signed)
Clinical Social Worker spoke with Worker's Comp case Chemical engineer in regards to negotiating a rate for SNF placement at discharge.  CSW attempted to inform patient of discharge plans.  Patient was down for x-ray and RN states that she would inform patient upon return.  CSW spoke with facility who have completed paperwork with patient and plan to admit tomorrow.  MD notified.  CSW to facilitate patient discharge needs once medically ready and rate has been negotiated with facility.  [address] [place], [place] [phone]
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
## 2011-09-26 NOTE — Progress Notes (Signed)
Awaiting placement  This patient has been seen and I agree with the findings and treatment plan.  Taelynn Mcelhannon O. Osiris Odriscoll, III, MD, FACS [phone] (pager) [phone] (direct pager) Trauma Surgeon 

## 2011-09-26 NOTE — Progress Notes (Signed)
Physical Therapy Treatment Patient Details Name: Theodore Gonzalez MRN: [id number] DOB: July 11, 1956 Today's Date: 09/26/2011  PT Assessment/Plan  PT - Assessment/Plan Comments on Treatment Session: continues to be very motivated; tolerated gentle increase in therex reps and quad set intensity well; educated in benefits of isometric quad contraction (muscle contraction in general) to mitigate effects of atrophy while NWB  PT Plan: Discharge plan remains appropriate;Frequency remains appropriate PT Frequency: Min 2X/week Follow Up Recommendations: Skilled nursing facility Equipment Recommended: Defer to next venue PT Goals  Acute Rehab PT Goals Time For Goal Achievement: 2 weeks Pt will Roll Supine to Right Side: with mod assist PT Goal: Rolling Supine to Right Side - Progress:  (NT) Pt will Roll Supine to Left Side:  (NT) PT Goal: Rolling Supine to Left Side - Progress:  (NT) Pt will go Supine/Side to Sit:  (NT) PT Goal: Supine/Side to Sit - Progress:  (NT) PT Goal: Sit at [facility] Of Bed - Progress:  (NT) PT Transfer Goal: Bed to Chair/Chair to Bed - Progress:  (NT) Additional Goals Additional Goal #1: PT will be independent in directing caregiver with assist for therex PT Goal: Additional Goal #1 - Progress: Progressing toward goal  PT Treatment Precautions/Restrictions  Precautions Precautions: Fall Precaution Comments:  (fall) Required Braces or Orthoses: Yes Other Brace/Splint: CAM boot R ankle at all times except with therapy or for hygeine; post-op shoe Left foot Restrictions Weight Bearing Restrictions: Yes RUE Weight Bearing: Non weight bearing RLE Weight Bearing: Non weight bearing LLE Weight Bearing: Non weight bearing Other Position/Activity Restrictions: Right UE sling; Bilateral knee and left ankle ROM as tolerated.; OK for PROM right ankle in sagittal plane Mobility (including Balance) Bed Mobility Bed Mobility: No   Pt politely declined OOB today Exercise  General  Exercises - Lower Extremity Ankle Circles/Pumps: AAROM;Left;20 reps;Supine;PROM;Right;15 reps (Sagittal plane only, not circles, gentle dorsiflexion range) Quad Sets: AROM;Right;Left;20 reps;Supine Short Arc Quad: AROM;Right;Left;10 reps;Supine Heel Slides: AAROM;Right;Left;10 reps;Supine Hip ABduction/ADduction: AAROM;Right;Left;10 reps;Supine End of Session PT - End of Session Activity Tolerance: Patient tolerated treatment well Patient left: in bed;with call bell in reach Nurse Communication: Mobility status for transfers General Behavior During Session: [facility] for tasks performed Cognition: [facility] for tasks performed  Van Clines [facility] [place], [place] [phone]  09/26/2011, 12:29 PM
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
## 2011-09-26 NOTE — Progress Notes (Signed)
Orthopedic Tech Progress Note Patient Details:  Theodore Gonzalez 1955-12-11 [id number]  Other Ortho Devices Type of Ortho Device: CAM walker;Postop boot Ortho Device Location: applied post op boo to left foot,CAM walker to right foot Ortho Device Interventions: Application   [name] 09/26/2011, 9:47 AM

## 2011-09-26 NOTE — Progress Notes (Signed)
Orthopedic Tech Progress Note Patient Details:  Theodore Gonzalez 05-23-1956 [id number]  Other Ortho Devices Type of Ortho Device: CAM walker Ortho Device Location: applied post op boo to left foot,CAM walker to right foot Ortho Device Interventions: Application Per patient nurse, boot was applied to right foot. JC 09/26/11 Ortho  [name] 09/26/2011, 9:49 AM

## 2011-09-27 MED ORDER — WARFARIN SODIUM 2.5 MG PO TABS
2.5000 mg | ORAL_TABLET | Freq: Once | ORAL | Status: DC
  Filled 2011-09-27: qty 1

## 2011-09-27 MED ORDER — METHOCARBAMOL 500 MG PO TABS: 500.0000 mg | ORAL_TABLET | Freq: Four times a day (QID) | ORAL | Status: AC | PRN

## 2011-09-27 MED ORDER — TRAMADOL HCL 50 MG PO TABS: 100.0000 mg | ORAL_TABLET | Freq: Four times a day (QID) | ORAL | Status: AC

## 2011-09-27 MED ORDER — WARFARIN SODIUM 5 MG PO TABS: 5.0000 mg | ORAL_TABLET | Freq: Every day | ORAL | Status: DC

## 2011-09-27 MED ORDER — OXYCODONE HCL 10 MG PO TABS: 10.0000 mg | ORAL_TABLET | ORAL | Status: AC | PRN

## 2011-09-27 MED ORDER — METRONIDAZOLE 500 MG PO TABS: 500.0000 mg | ORAL_TABLET | Freq: Three times a day (TID) | ORAL | Status: AC
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
## 2011-09-27 NOTE — Progress Notes (Signed)
Clinical Social Worker facilitated patient discharge including notifying patient and facility of transfer.  Worker's Public librarian has been notified by facility of patient discharge plans today.  Patient states that he will notify family members of his transfer to facility.  Clinical Social Worker to arrange ambulance transportation to [facility] once patient is finished eating lunch.  Clinical Social Worker will sign off for now as social work intervention is no longer needed. Please consult [place] again if new need arises.   [address] [place], [place] [phone]

## 2011-09-27 NOTE — Progress Notes (Signed)
Patient's scrotum still extremely swollen. As patient was getting a bath, small ulcer/opening was noticed where penis meets scrotum which is also the source for some drainage. Drainage color unknown, possibly serosanguinous. Will pass along to day shift nurse and MD.  Thanks, Nursing

## 2011-09-27 NOTE — Discharge Summary (Signed)
The patient has a small penile ulceration that should not be a problem.  Stage II sacral decubitus is covered and should continue to heal as long as patient is kept off of it.  This patient has been seen and I agree with the findings and treatment plan.  Marta Lamas. Gae Bon, MD, FACS [phone] (pager) [phone] (direct pager) Trauma Surgeon

## 2011-09-27 NOTE — Progress Notes (Signed)
This patient has been seen and I agree with the findings and treatment plan.  Yanelis Osika O. Evonte Prestage, III, MD, FACS [phone] (pager) [phone] (direct pager) Trauma Surgeon  

## 2012-01-16 ENCOUNTER — Other Ambulatory Visit: Payer: Self-pay | Admitting: Orthopedic Surgery

## 2012-01-16 DIAGNOSIS — M25511 Pain in right shoulder: Secondary | ICD-10-CM

## 2012-01-22 ENCOUNTER — Telehealth (INDEPENDENT_AMBULATORY_CARE_PROVIDER_SITE_OTHER): Payer: Self-pay | Admitting: General Surgery

## 2012-01-22 ENCOUNTER — Ambulatory Visit
Admission: RE | Admit: 2012-01-22 | Discharge: 2012-01-22 | Disposition: A | Discharge status: Home / Self Care | Payer: Worker's Compensation | Source: Ambulatory Visit | Attending: Orthopedic Surgery | Admitting: Orthopedic Surgery

## 2012-01-22 DIAGNOSIS — M25511 Pain in right shoulder: Secondary | ICD-10-CM

## 2012-02-20 ENCOUNTER — Encounter (INDEPENDENT_AMBULATORY_CARE_PROVIDER_SITE_OTHER): Payer: Self-pay | Admitting: General Surgery

## 2012-02-20 ENCOUNTER — Ambulatory Visit (INDEPENDENT_AMBULATORY_CARE_PROVIDER_SITE_OTHER): Payer: Worker's Compensation | Admitting: General Surgery

## 2012-02-20 VITALS — BP 126/98 | HR 88 | Temp 98.2°F | Resp 16 | Ht 68.0 in | Wt 187.4 lb

## 2012-02-20 DIAGNOSIS — R109 Unspecified abdominal pain: Secondary | ICD-10-CM

## 2012-02-20 DIAGNOSIS — N529 Male erectile dysfunction, unspecified: Secondary | ICD-10-CM | POA: Insufficient documentation

## 2012-02-20 DIAGNOSIS — R1031 Right lower quadrant pain: Secondary | ICD-10-CM | POA: Insufficient documentation
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
## 2012-03-20 NOTE — Progress Notes (Signed)
To come in early [place]-had ekg 12/12 when in hospital post trama

## 2012-03-24 ENCOUNTER — Other Ambulatory Visit: Payer: Self-pay | Admitting: Orthopedic Surgery

## 2012-03-25 ENCOUNTER — Encounter (HOSPITAL_BASED_OUTPATIENT_CLINIC_OR_DEPARTMENT_OTHER): Payer: Self-pay | Admitting: Anesthesiology

## 2012-03-25 ENCOUNTER — Ambulatory Visit (HOSPITAL_BASED_OUTPATIENT_CLINIC_OR_DEPARTMENT_OTHER)
Admission: RE | Admit: 2012-03-25 | Discharge: 2012-03-25 | Disposition: A | Discharge status: Home / Self Care | Payer: Worker's Compensation | Source: Ambulatory Visit | Attending: Orthopedic Surgery | Admitting: Orthopedic Surgery

## 2012-03-25 ENCOUNTER — Encounter (HOSPITAL_BASED_OUTPATIENT_CLINIC_OR_DEPARTMENT_OTHER)
Admission: RE | Disposition: A | Discharge status: Home / Self Care | Payer: Self-pay | Source: Ambulatory Visit | Attending: Orthopedic Surgery

## 2012-03-25 ENCOUNTER — Ambulatory Visit (HOSPITAL_BASED_OUTPATIENT_CLINIC_OR_DEPARTMENT_OTHER): Payer: Worker's Compensation | Admitting: Anesthesiology

## 2012-03-25 ENCOUNTER — Encounter (HOSPITAL_BASED_OUTPATIENT_CLINIC_OR_DEPARTMENT_OTHER): Payer: Self-pay | Admitting: *Deleted

## 2012-03-25 DIAGNOSIS — F17200 Nicotine dependence, unspecified, uncomplicated: Secondary | ICD-10-CM | POA: Insufficient documentation

## 2012-03-25 DIAGNOSIS — M6580 Other synovitis and tenosynovitis, unspecified site: Secondary | ICD-10-CM | POA: Insufficient documentation

## 2012-03-25 DIAGNOSIS — M25819 Other specified joint disorders, unspecified shoulder: Secondary | ICD-10-CM | POA: Insufficient documentation

## 2012-03-25 DIAGNOSIS — M67919 Unspecified disorder of synovium and tendon, unspecified shoulder: Secondary | ICD-10-CM | POA: Insufficient documentation

## 2012-03-25 DIAGNOSIS — M719 Bursopathy, unspecified: Secondary | ICD-10-CM | POA: Insufficient documentation

## 2012-03-25 DIAGNOSIS — I10 Essential (primary) hypertension: Secondary | ICD-10-CM | POA: Insufficient documentation

## 2012-03-25 DIAGNOSIS — S43004A Unspecified dislocation of right shoulder joint, initial encounter: Secondary | ICD-10-CM

## 2012-03-25 HISTORY — PX: SHOULDER ARTHROSCOPY: SHX128

## 2012-03-25 LAB — POCT I-STAT, CHEM 8
BUN: 14 mg/dL (ref 6–23)
Calcium, Ion: 1.14 mmol/L (ref 1.12–1.32)
Chloride: 104 mEq/L (ref 96–112)
Creatinine, Ser: 0.7 mg/dL (ref 0.50–1.35)
Glucose, Bld: 108 mg/dL — ABNORMAL HIGH (ref 70–99)
HCT: 41 % (ref 39.0–52.0)
Potassium: 3.5 mEq/L (ref 3.5–5.1)

## 2012-03-25 SURGERY — EXAM UNDER ANESTHESIA, SHOULDER, WITH MANIPULATION
Anesthesia: General | Site: Shoulder | Laterality: Right | Wound class: Clean

## 2012-03-25 MED ORDER — MIDAZOLAM HCL 2 MG/2ML IJ SOLN
0.5000 mg | INTRAMUSCULAR | Status: DC | PRN
  Administered 2012-03-25: 2 mg via INTRAVENOUS

## 2012-03-25 MED ORDER — OXYCODONE HCL 5 MG PO TABS: 5.0000 mg | ORAL_TABLET | Freq: Once | ORAL | Status: DC | PRN

## 2012-03-25 MED ORDER — METOCLOPRAMIDE HCL 5 MG/ML IJ SOLN: 10.0000 mg | Freq: Once | INTRAMUSCULAR | Status: DC | PRN

## 2012-03-25 MED ORDER — BUPIVACAINE HCL (PF) 0.5 % IJ SOLN
INTRAMUSCULAR | Status: DC | PRN
  Administered 2012-03-25: 20 mL

## 2012-03-25 MED ORDER — CEFAZOLIN SODIUM 1-5 GM-% IV SOLN
INTRAVENOUS | Status: DC | PRN
  Administered 2012-03-25: 2 g via INTRAVENOUS

## 2012-03-25 MED ORDER — LACTATED RINGERS IV SOLN
INTRAVENOUS | Status: DC
  Administered 2012-03-25 (×3): via INTRAVENOUS

## 2012-03-25 MED ORDER — OXYCODONE-ACETAMINOPHEN 5-325 MG PO TABS: 1.0000 | ORAL_TABLET | ORAL | Status: AC | PRN

## 2012-03-25 MED ORDER — PROMETHAZINE HCL 25 MG RE SUPP
25.0000 mg | Freq: Four times a day (QID) | RECTAL | Status: DC | PRN
  Administered 2012-03-25: 25 mg via RECTAL

## 2012-03-25 MED ORDER — LIDOCAINE HCL (CARDIAC) 10 MG/ML IV SOLN
INTRAVENOUS | Status: DC | PRN
  Administered 2012-03-25: 100 mg via INTRAVENOUS

## 2012-03-25 MED ORDER — DEXAMETHASONE SODIUM PHOSPHATE 4 MG/ML IJ SOLN
INTRAMUSCULAR | Status: DC | PRN
  Administered 2012-03-25: 10 mg via INTRAVENOUS

## 2012-03-25 MED ORDER — SODIUM CHLORIDE 0.9 % IR SOLN
Status: DC | PRN
  Administered 2012-03-25: 8000 mL

## 2012-03-25 MED ORDER — ONDANSETRON HCL 4 MG/2ML IJ SOLN
INTRAMUSCULAR | Status: DC | PRN
  Administered 2012-03-25: 4 mg via INTRAVENOUS

## 2012-03-25 MED ORDER — PROPOFOL 10 MG/ML IV EMUL
INTRAVENOUS | Status: DC | PRN
  Administered 2012-03-25: 200 mg via INTRAVENOUS
  Administered 2012-03-25: 30 mg via INTRAVENOUS

## 2012-03-25 MED ORDER — FENTANYL CITRATE 0.05 MG/ML IJ SOLN
INTRAMUSCULAR | Status: DC | PRN
  Administered 2012-03-25 (×2): 25 ug via INTRAVENOUS

## 2012-03-25 MED ORDER — HYDROMORPHONE HCL PF 1 MG/ML IJ SOLN: 0.2500 mg | INTRAMUSCULAR | Status: DC | PRN

## 2012-03-25 MED ORDER — SUCCINYLCHOLINE CHLORIDE 20 MG/ML IJ SOLN
INTRAMUSCULAR | Status: DC | PRN
  Administered 2012-03-25: 100 mg via INTRAVENOUS

## 2012-03-25 MED ORDER — FENTANYL CITRATE 0.05 MG/ML IJ SOLN
50.0000 ug | INTRAMUSCULAR | Status: DC | PRN
  Administered 2012-03-25: 100 ug via INTRAVENOUS

## 2012-03-25 MED ORDER — NAPROXEN 500 MG PO TABS: 500.0000 mg | ORAL_TABLET | Freq: Two times a day (BID) | ORAL | Status: AC

## 2012-03-25 SURGICAL SUPPLY — 88 items
ADH SKN CLS APL DERMABOND .7 (GAUZE/BANDAGES/DRESSINGS)
APL SKNCLS STERI-STRIP NONHPOA (GAUZE/BANDAGES/DRESSINGS)
BENZOIN TINCTURE PRP APPL 2/3 (GAUZE/BANDAGES/DRESSINGS) IMPLANT
BLADE 4.2CUDA (BLADE) IMPLANT
BLADE CUDA 5.5 (BLADE) IMPLANT
BLADE CUTTER GATOR 3.5 (BLADE) IMPLANT
BLADE GREAT WHITE 4.2 (BLADE) IMPLANT
BLADE SURG 15 STRL LF DISP TIS (BLADE) IMPLANT
BLADE SURG 15 STRL SS (BLADE)
BLADE SURG ROTATE 9660 (MISCELLANEOUS) IMPLANT
BLADE VORTEX 6.0 (BLADE) IMPLANT
BNDG COHESIVE 4X5 TAN STRL (GAUZE/BANDAGES/DRESSINGS) ×2 IMPLANT
BUR 3.5 LG SPHERICAL (BURR) IMPLANT
BUR OVAL 4.0 (BURR) ×3 IMPLANT
BUR OVAL 6.0 (BURR) IMPLANT
BURR 3.5 LG SPHERICAL (BURR)
CANISTER OMNI JUG 16 LITER (MISCELLANEOUS) ×3 IMPLANT
CANISTER SUCTION 2500CC (MISCELLANEOUS) IMPLANT
CANNULA 5.75X71 LONG (CANNULA) ×3 IMPLANT
CANNULA TWIST IN 8.25X7CM (CANNULA) IMPLANT
CHLORAPREP W/TINT 26ML (MISCELLANEOUS) ×3 IMPLANT
CLOTH BEACON ORANGE TIMEOUT ST (SAFETY) ×3 IMPLANT
DECANTER SPIKE VIAL GLASS SM (MISCELLANEOUS) IMPLANT
DERMABOND ADVANCED (GAUZE/BANDAGES/DRESSINGS)
DERMABOND ADVANCED .7 DNX12 (GAUZE/BANDAGES/DRESSINGS) IMPLANT
DRAPE INCISE IOBAN 66X45 STRL (DRAPES) ×3 IMPLANT
DRAPE STERI 35X30 U-POUCH (DRAPES) ×3 IMPLANT
DRAPE SURG 17X23 STRL (DRAPES) ×3 IMPLANT
DRAPE U 20/CS (DRAPES) ×3 IMPLANT
DRAPE U-SHAPE 47X51 STRL (DRAPES) ×3 IMPLANT
DRAPE U-SHAPE 76X120 STRL (DRAPES) ×4 IMPLANT
DRSG PAD ABDOMINAL 8X10 ST (GAUZE/BANDAGES/DRESSINGS) ×3 IMPLANT
ELECT REM PT RETURN 9FT ADLT (ELECTROSURGICAL) ×3
ELECTRODE REM PT RTRN 9FT ADLT (ELECTROSURGICAL) ×2 IMPLANT
GAUZE SPONGE 4X4 16PLY XRAY LF (GAUZE/BANDAGES/DRESSINGS) IMPLANT
GAUZE XEROFORM 1X8 LF (GAUZE/BANDAGES/DRESSINGS) ×3 IMPLANT
GLOVE BIO SURGEON STRL SZ 6.5 (GLOVE) ×2 IMPLANT
GLOVE BIO SURGEON STRL SZ7.5 (GLOVE) ×4 IMPLANT
GLOVE BIOGEL PI IND STRL 8 (GLOVE) ×3 IMPLANT
GLOVE BIOGEL PI INDICATOR 8 (GLOVE) ×1
GOWN PREVENTION PLUS XLARGE (GOWN DISPOSABLE) ×3 IMPLANT
GOWN PREVENTION PLUS XXLARGE (GOWN DISPOSABLE) ×2 IMPLANT
NDL 1/2 CIR CATGUT .05X1.09 (NEEDLE) IMPLANT
NDL SCORPION MULTI FIRE (NEEDLE) IMPLANT
NDL SUT 6 .5 CRC .975X.05 MAYO (NEEDLE) IMPLANT
NEEDLE 1/2 CIR CATGUT .05X1.09 (NEEDLE) IMPLANT
NEEDLE MAYO TAPER (NEEDLE)
NEEDLE SCORPION MULTI FIRE (NEEDLE) IMPLANT
NS IRRIG 1000ML POUR BTL (IV SOLUTION) IMPLANT
PACK ARTHROSCOPY DSU (CUSTOM PROCEDURE TRAY) ×3 IMPLANT
PACK BASIN DAY SURGERY FS (CUSTOM PROCEDURE TRAY) ×3 IMPLANT
PENCIL BUTTON HOLSTER BLD 10FT (ELECTRODE) IMPLANT
RESECTOR FULL RADIUS 4.2MM (BLADE) ×3 IMPLANT
RESECTOR FULL RADIUS 4.8MM (BLADE) IMPLANT
SHEET MEDIUM DRAPE 40X70 STRL (DRAPES) IMPLANT
SLEEVE SCD COMPRESS KNEE MED (MISCELLANEOUS) ×3 IMPLANT
SLING ARM FOAM STRAP LRG (SOFTGOODS) ×2 IMPLANT
SLING ARM FOAM STRAP MED (SOFTGOODS) IMPLANT
SLING ARM FOAM STRAP XLG (SOFTGOODS) IMPLANT
SLING ARM IMMOBILIZER MED (SOFTGOODS) IMPLANT
SPONGE GAUZE 4X4 12PLY (GAUZE/BANDAGES/DRESSINGS) ×3 IMPLANT
SPONGE LAP 4X18 X RAY DECT (DISPOSABLE) IMPLANT
STRIP CLOSURE SKIN 1/2X4 (GAUZE/BANDAGES/DRESSINGS) IMPLANT
SUCTION FRAZIER TIP 10 FR DISP (SUCTIONS) IMPLANT
SUPPORT WRAP ARM LG (MISCELLANEOUS) IMPLANT
SUT BONE WAX W31G (SUTURE) IMPLANT
SUT ETHIBOND 2 OS 4 DA (SUTURE) IMPLANT
SUT ETHILON 3 0 PS 1 (SUTURE) ×3 IMPLANT
SUT ETHILON 4 0 PS 2 18 (SUTURE) IMPLANT
SUT FIBERWIRE #2 38 T-5 BLUE (SUTURE)
SUT FIBERWIRE 2-0 18 17.9 3/8 (SUTURE)
SUT MNCRL AB 3-0 PS2 18 (SUTURE) IMPLANT
SUT MNCRL AB 4-0 PS2 18 (SUTURE) IMPLANT
SUT PDS AB 0 CT 36 (SUTURE) IMPLANT
SUT PROLENE 3 0 PS 2 (SUTURE) IMPLANT
SUT VIC AB 0 CT1 18XCR BRD 8 (SUTURE) IMPLANT
SUT VIC AB 0 CT1 8-18 (SUTURE)
SUT VIC AB 2-0 SH 18 (SUTURE) IMPLANT
SUTURE FIBERWR #2 38 T-5 BLUE (SUTURE) IMPLANT
SUTURE FIBERWR 2-0 18 17.9 3/8 (SUTURE) IMPLANT
SYR BULB 3OZ (MISCELLANEOUS) IMPLANT
TOWEL OR 17X24 6PK STRL BLUE (TOWEL DISPOSABLE) ×3 IMPLANT
TOWEL OR NON WOVEN STRL DISP B (DISPOSABLE) ×3 IMPLANT
TUBE CONNECTING 20X1/4 (TUBING) ×3 IMPLANT
TUBING ARTHROSCOPY IRRIG 16FT (MISCELLANEOUS) ×3 IMPLANT
WAND STAR VAC 90 (SURGICAL WAND) ×3 IMPLANT
WATER STERILE IRR 1000ML POUR (IV SOLUTION) ×3 IMPLANT
YANKAUER SUCT BULB TIP NO VENT (SUCTIONS) IMPLANT

## 2012-03-25 NOTE — Addendum Note (Signed)
Addendum  created 03/25/12 1714 by Kerby Nora, MD   Modules edited:Orders

## 2012-03-25 NOTE — Anesthesia Preprocedure Evaluation (Signed)
Anesthesia Evaluation  Patient identified by MRN, date of birth, ID band Patient awake    Reviewed: Allergy & Precautions, H&P , NPO status , Patient's Chart, lab work & pertinent test results, reviewed documented beta blocker date and time   Airway Mallampati: II TM Distance: >3 FB Neck ROM: full    Dental   Pulmonary neg pulmonary ROS,          Cardiovascular hypertension, On Medications     Neuro/Psych negative neurological ROS  negative psych ROS   GI/Hepatic negative GI ROS, Neg liver ROS,   Endo/Other  negative endocrine ROS  Renal/GU negative Renal ROS  negative genitourinary   Musculoskeletal   Abdominal   Peds  Hematology negative hematology ROS (+)   Anesthesia Other Findings See surgeon's H&P   Reproductive/Obstetrics negative OB ROS                           Anesthesia Physical Anesthesia Plan  ASA: II  Anesthesia Plan: General   Post-op Pain Management:    Induction: Intravenous  Airway Management Planned: Oral ETT  Additional Equipment:   Intra-op Plan:   Post-operative Plan: Extubation in OR  Informed Consent: I have reviewed the patients History and Physical, chart, labs and discussed the procedure including the risks, benefits and alternatives for the proposed anesthesia with the patient or authorized representative who has indicated his/her understanding and acceptance.   Dental Advisory Given  Plan Discussed with: CRNA and Surgeon  Anesthesia Plan Comments:         Anesthesia Quick Evaluation

## 2012-03-25 NOTE — Op Note (Signed)
Procedure(s): EXAM UNDER ANESTHESIA WITH MANIPULATION OF SHOULDER ARTHROSCOPY SHOULDER Procedure Note  Theodore Gonzalez male 56 y.o. 03/25/2012  Procedure(s) and Anesthesia Type:    #1 arthroscopic capsular release right shoulder #2. Arthroscopic debridement partial thickness subscapularis tear and irreparable supraspinatus and infraspinatus tears with extensive synovitis right shoulder, biceps tenotomy #3 manipulation under anesthesia right shoulder.  Postoperative diagnosis: #1 right shoulder adhesive capsulitis #2 right shoulder partial-thickness subscapularis tear and irreparable supraspinatus and infraspinatus tears with extensive synovitis, proximal long head biceps tear. #3 right shoulder adhesive capsulitis  Surgeon(s) and Role:    * Mable Paris, MD - Primary     Surgeon: Mable Paris   Assistants: None  Anesthesia: General endotracheal anesthesia with preoperative interscalene block given by the attending      Procedure Detail   Estimated Blood Loss: Min         Drains: none  Blood Given: none         Specimens: none        Complications:  * No complications entered in OR log *         Disposition: PACU - hemodynamically stable.         Condition: stable    Procedure:   INDICATIONS FOR SURGERY: The patient is 56 y.o. male who had a severe trauma approximately 7 months ago. He was treated for pelvic and ankle injuries and was noted at that time to have a right shoulder dislocation. He had significant dysfunction stiffness in the shoulder. He had physical therapy failed to progress. An MRI revealed retracted full-thickness tears of the supra-and infraspinatus with significant muscle atrophy. He is indicated for operative treatment to release the capsule try and restore passive and active motion. He understood that based on MRI findings the likelihood of being able to repair the rotator cuff would be very low.  OPERATIVE  FINDINGS: Examination under anesthesia: Severe stiffness: Preoperative for flexion; 50, external rotation; 0, abduction; 70, abducted external rotation; 10, abducted internal rotation; 20. At the conclusion of the procedure he had 170 forward flexion, 50 external rotation, 150 of abduction, and at 90 of abduction he had 90 external rotation and 60 internal rotation. Diagnostic Arthroscopy:  Glenoid articular cartilage: Intact Humeral head articular cartilage: Intact with large posterior Hill-Sachs defect Labrum: Anterior tear debrided, extensive tearing of the long head biceps tendon longitudinally involving greater than 50% of the tendon. A biceps tenotomy was performed. Loose bodies: None Synovitis: Extensive rotator cuff: Retracted full-thickness tears of the supra-and infraspinatus beyond the level of the glenoid, this was not repairable He was noted to have extensive scarring in the subacromial space as well. He had a partial-thickness tear of the subscapularis. This was debrided.  DESCRIPTION OF PROCEDURE: The patient was identified in preoperative  holding area where I personally marked the operative site after  verifying site, side, and procedure with the patient. An interscalene block was given by the attending anesthesiologist the holding area.  The patient was taken back to the operating room where general anesthesia was induced without complication and was placed in the beach-chair position with the back  elevated about 60 degrees and all extremities and head and neck carefully padded and  positioned. The examination under anesthesia was carried out with measurements as described above.  The right upper extremity was then prepped and  draped in a standard sterile fashion. The appropriate time-out  procedure was carried out. The patient did receive IV antibiotics  within 30 minutes of incision.  A small posterior portal incision was made and the arthroscope was introduced  into the joint. An anterior portal was then established above the subscapularis using needle localization. Small cannula was placed anteriorly. Diagnostic arthroscopy was then carried out with findings as described above.  The rotator cuff tear was noted to be retracted to the level of the glenoid and after some capsular release under the cuff and over the labrum was determined that this would not be repairable. The long head biceps tendon was pulled into the joint was noted to have extensive longitudinal tearing and was felt to be best treated with the tenotomy for pain relief given the setting of irreparable rotator cuff tear. The anterior subscapularis partial tear was debrided. This was not felt to require formal repair. The capsular release was then begun by opening the rotator interval using the ArthroCare and shaver. The rotator interval tissue was noted to be thickened. It was removed to the level of posterior coracoid and the coracoacromial ligament and conjoined tendon were identified. The posterior aspect of the coracoid was skeletonized and released the coracohumeral ligament. The middle glenohumeral ligament was then separated from the subscapularis and the biter in combination with shaver and ArthriCare was used carefully to release the anterior capsule down to the 5:00 position. The edges were debrided with the shaver to remain separated.  The camera was then moved to the anterior portal and through the posterior portal the posterior capsular release was carried out in the same fashion as anteriorly down to about the 7:00 position. The arthroscope was then placed in the subacromial space from posteriorly and the lateral portal was established to debride and released the subacromial adhesions. The tuberosity was cleared of any excess scar tissue and smoothed. There is no significant fraying of the coracoacromial ligament. I did not feel that there was significant abnormal anatomy of anterior  acromion and given the potential for anterior superior escape I did not take down the coracoacromial ligament.  At this point the arthroscopic equipment was removed from the joint and manipulation under anesthesia was carried out with forward elevation, external rotation at the side, abduction and abducted external and internal rotation. A notable release of adhesions was felt. The final range of motion is described above in findings.    The portals were closed with 3-0 nylon in an interrupted fashion. Sterile dressings were then applied including Xeroform 4 x 4's ABDs and tape. The patient was then allowed to awaken from general anesthesia, placed in a sling, transferred to the stretcher and taken to the recovery room in stable condition.   POSTOPERATIVE PLAN: The patient will be discharged home today and will followup in one week for suture removal and wound check.  He will begin physical therapy immediately.

## 2012-03-25 NOTE — Transfer of Care (Signed)
Immediate Anesthesia Transfer of Care Note  Patient: Theodore Gonzalez  Procedure(s) Performed: Procedure(s) (LRB): EXAM UNDER ANESTHESIA WITH MANIPULATION OF SHOULDER (Right) ARTHROSCOPY SHOULDER (Right)  Patient Location: PACU  Anesthesia Type: GA combined with regional for post-op pain  Level of Consciousness: awake and patient cooperative  Airway & Oxygen Therapy: Patient Spontanous Breathing and Patient connected to face mask oxygen  Post-op Assessment: Report given to PACU RN and Post -op Vital signs reviewed and stable  Post vital signs: Reviewed and stable  Complications: No apparent anesthesia complications

## 2012-03-25 NOTE — Anesthesia Postprocedure Evaluation (Signed)
  Anesthesia Post-op Note  Patient: Theodore Gonzalez  Procedure(s) Performed: Procedure(s) (LRB): EXAM UNDER ANESTHESIA WITH MANIPULATION OF SHOULDER (Right) ARTHROSCOPY SHOULDER (Right)  Patient Location: PACU  Anesthesia Type: GA combined with regional for post-op pain  Level of Consciousness: awake, alert  and oriented  Airway and Oxygen Therapy: Patient Spontanous Breathing and Patient connected to face mask oxygen  Post-op Pain: mild  Post-op Assessment: Post-op Vital signs reviewed  Post-op Vital Signs: Reviewed  Complications: No apparent anesthesia complications

## 2012-03-25 NOTE — H&P (Signed)
Theodore Gonzalez is an 56 y.o. male.   Chief Complaint: R shoulder pain and stiffness HPI: h/o R shoulder dislocation with trauma 12/12.  Now with stiffness, dysfunction and pain refractory to nonop tx.  MRI shows severe atrophy of supra with retracted tendon tears.  Past Medical History  Diagnosis Date  . Hypertension   . Tobacco abuse     Past Surgical History  Procedure Date  . External fixation pelvis 09/10/2011    Procedure: EXTERNAL FIXATION PELVIS;  Surgeon: Vania Rea Supple;  Location: [facility] OR;  Service: Orthopedics;  Laterality: N/A;  . Irrigation and debridement knee 09/10/2011    Procedure: IRRIGATION AND DEBRIDEMENT KNEE;  Surgeon: Vania Rea Supple;  Location: [facility] OR;  Service: Orthopedics;  Laterality: Left;  . Orif pelvic fracture 09/14/2011    Procedure: OPEN REDUCTION INTERNAL FIXATION (ORIF) PELVIC FRACTURE;  Surgeon: Budd Palmer;  Location: [facility] OR;  Service: Orthopedics;  Laterality: N/A;  . Orif ankle fracture 09/14/2011    Procedure: OPEN REDUCTION INTERNAL FIXATION (ORIF) ANKLE FRACTURE;  Surgeon: Budd Palmer;  Location: [facility] OR;  Service: Orthopedics;  Laterality: Right;    History reviewed. No pertinent family history. Social History:  reports that he quit smoking about 24 years ago. He does not have any smokeless tobacco history on file. He reports that he does not drink alcohol or use illicit drugs.  Allergies: No Known Allergies  Medications Prior to Admission  Medication Sig Dispense Refill  . aspirin 81 MG tablet Take 81 mg by mouth daily.        Marland Kitchen atorvastatin (LIPITOR) 40 MG tablet Take 40 mg by mouth daily.        Marland Kitchen omeprazole (PRILOSEC) 20 MG capsule Take 20 mg by mouth 2 (two) times daily.        . valsartan-hydrochlorothiazide (DIOVAN-HCT) 160-25 MG per tablet Take 1 tablet by mouth daily.        Marland Kitchen amLODipine (NORVASC) 5 MG tablet Take 5 mg by mouth daily.          Results for orders placed during the hospital encounter of 03/25/12 (from the past 48  hour(s))  POCT I-STAT, CHEM 8     Status: Abnormal   Collection Time   03/25/12 12:52 PM      Component Value Range Comment   Sodium 140  135 - 145 mEq/L    Potassium 3.5  3.5 - 5.1 mEq/L    Chloride 104  96 - 112 mEq/L    BUN 14  6 - 23 mg/dL    Creatinine, Ser 1.61  0.50 - 1.35 mg/dL    Glucose, Bld 096 (*) 70 - 99 mg/dL    Calcium, Ion 0.45  4.09 - 1.32 mmol/L    TCO2 24  0 - 100 mmol/L    Hemoglobin 13.9  13.0 - 17.0 g/dL    HCT 81.1  91.4 - 78.2 %    No results found.  Review of Systems  All other systems reviewed and are negative.    Blood pressure 138/85, pulse 81, temperature 99.3 F (37.4 C), temperature source Oral, resp. rate 12, height 5\' 8"  (1.727 m), weight 86.183 kg (190 lb), SpO2 100.00%. Physical Exam  Constitutional: He is oriented to person, place, and time. He appears well-developed and well-nourished.  HENT:  Head: Atraumatic.  Eyes: EOM are normal.  Cardiovascular: Intact distal pulses.   Respiratory: Effort normal.  Musculoskeletal:       Right shoulder: He exhibits decreased range  of motion and tenderness.  Neurological: He is alert and oriented to person, place, and time.  Skin: Skin is warm and dry.  Psychiatric: He has a normal mood and affect.     Assessment/Plan Severe R shoulder stiffness with underlying massive RCT Plan diagnostic arthroscopy, capsular release, MUA and possible RCR. Risks / benefits of surgery discussed Consent on chart  NPO for OR Preop antibiotics   Sebrena Engh WILLIAM 03/25/2012, 2:10 PM

## 2012-03-25 NOTE — Progress Notes (Signed)
Assisted Dr. Frederick with right, ultrasound guided, interscalene  block. Side rails up, monitors on throughout procedure. See vital signs in flow sheet. Tolerated Procedure well. 

## 2012-03-25 NOTE — Anesthesia Procedure Notes (Addendum)
Anesthesia Regional Block:  Interscalene brachial plexus block  Pre-Anesthetic Checklist: ,, timeout performed, Correct Patient, Correct Site, Correct Laterality, Correct Procedure, Correct Position, site marked, Risks and benefits discussed,  Surgical consent,  Pre-op evaluation,  At surgeon's request and post-op pain management  Laterality: Right  Prep: chloraprep       Needles:   Needle Type: Other   (Arrow Echogenic)   Needle Length: 9cm  Needle Gauge: 21    Additional Needles:  Procedures: ultrasound guided Interscalene brachial plexus block Narrative:  Start time: 03/25/2012 12:58 PM End time: 03/25/2012 1:05 PM Injection made incrementally with aspirations every 5 mL.  Performed by: Personally  Anesthesiologist: Aldona Lento, MD  Additional Notes: Ultrasound guidance used to: id relevant anatomy, confirm needle position, local anesthetic spread, avoidance of vascular puncture. Picture saved. No complications. Block performed personally by Janetta Hora. Gelene Mink, MD    Interscalene brachial plexus block Procedure Name: Intubation Date/Time: 03/25/2012 2:35 PM Performed by: Gar Gibbon Pre-anesthesia Checklist: Patient identified, Emergency Drugs available, Suction available and Patient being monitored Patient Re-evaluated:Patient Re-evaluated prior to inductionOxygen Delivery Method: Circle System Utilized Preoxygenation: Pre-oxygenation with 100% oxygen Intubation Type: IV induction Ventilation: Mask ventilation without difficulty Laryngoscope Size: Miller and 2 Grade View: Grade II Tube type: Oral Tube size: 8.0 mm Number of attempts: 1 Airway Equipment and Method: stylet and oral airway Placement Confirmation: ETT inserted through vocal cords under direct vision,  positive ETCO2 and breath sounds checked- equal and bilateral Secured at: 22 cm Tube secured with: Tape Dental Injury: Teeth and Oropharynx as per pre-operative assessment

## 2012-03-28 ENCOUNTER — Encounter (HOSPITAL_BASED_OUTPATIENT_CLINIC_OR_DEPARTMENT_OTHER): Payer: Self-pay | Admitting: Orthopedic Surgery

## 2012-04-01 ENCOUNTER — Encounter (HOSPITAL_BASED_OUTPATIENT_CLINIC_OR_DEPARTMENT_OTHER): Payer: Self-pay

## 2012-09-18 ENCOUNTER — Other Ambulatory Visit: Payer: Self-pay | Admitting: Orthopedic Surgery

## 2012-09-19 ENCOUNTER — Other Ambulatory Visit: Payer: Self-pay | Admitting: Orthopedic Surgery

## 2012-09-19 DIAGNOSIS — K469 Unspecified abdominal hernia without obstruction or gangrene: Secondary | ICD-10-CM

## 2012-09-20 ENCOUNTER — Ambulatory Visit
Admission: RE | Admit: 2012-09-20 | Discharge: 2012-09-20 | Disposition: A | Discharge status: Home / Self Care | Payer: Worker's Compensation | Source: Ambulatory Visit | Attending: Orthopedic Surgery | Admitting: Orthopedic Surgery

## 2012-09-20 DIAGNOSIS — K469 Unspecified abdominal hernia without obstruction or gangrene: Secondary | ICD-10-CM

## 2012-09-23 ENCOUNTER — Other Ambulatory Visit: Payer: Self-pay | Admitting: Orthopedic Surgery

## 2012-09-23 DIAGNOSIS — R52 Pain, unspecified: Secondary | ICD-10-CM

## 2012-09-27 ENCOUNTER — Other Ambulatory Visit: Payer: Worker's Compensation

## 2013-12-07 ENCOUNTER — Ambulatory Visit (INDEPENDENT_AMBULATORY_CARE_PROVIDER_SITE_OTHER): Payer: BC Managed Care – PPO | Admitting: Cardiovascular Disease

## 2013-12-07 VITALS — BP 111/75 | HR 84 | Ht 66.0 in | Wt 196.1 lb

## 2013-12-07 DIAGNOSIS — R42 Dizziness and giddiness: Secondary | ICD-10-CM

## 2013-12-07 DIAGNOSIS — E785 Hyperlipidemia, unspecified: Secondary | ICD-10-CM | POA: Insufficient documentation

## 2013-12-07 DIAGNOSIS — I10 Essential (primary) hypertension: Secondary | ICD-10-CM

## 2013-12-07 MED ORDER — AMLODIPINE BESYLATE 10 MG PO TABS: 10.0000 mg | ORAL_TABLET | Freq: Every day | ORAL | Status: DC

## 2013-12-07 NOTE — Progress Notes (Signed)
Patient ID: Theodore Gonzalez, male   DOB: 1956/03/07, 58 y.o.   MRN: [id number]       CARDIOLOGY CONSULT NOTE  Patient ID: Theodore Gonzalez MRN: [id number] DOB/AGE: 1956/05/04 58 y.o.  Admit date: (Not on file) Primary Physician Josue Hector, MD  Reason for Consultation:   HPI: The patient is a 58 year old male who I am evaluating for the first time. He has a history of hypertension and hyperlipidemia. He has a history of tobacco use dating back over 25 years ago. He tells me that he has been having difficulty regulating his blood pressure. His medication dose was increased approximately 4 weeks ago. He has a wrist cuff at home. However, he has had his blood pressure checked [place] the fire station and readings have ranged between 122-152/78-98. Heart rate remains in the 80 beat per minute range with normal oxygen saturations. When his blood pressure is high, he feels lightheaded and dizzy but denies syncope. He denies chest pain, shortness of breath, palpitations and leg swelling. He also feels fatigued when his BP is elevated. He works doing Photographer and plumbing. When he feels like this, he has to sit down for 60-90 minutes before symptoms resolve.   No Known Allergies  Current Outpatient Prescriptions  Medication Sig Dispense Refill  . amLODipine (NORVASC) 5 MG tablet Take 5 mg by mouth daily.        Marland Kitchen atorvastatin (LIPITOR) 40 MG tablet Take 40 mg by mouth daily.        . methocarbamol (ROBAXIN) 500 MG tablet Take 500 mg by mouth every 12 (twelve) hours.      Marland Kitchen omeprazole (PRILOSEC) 20 MG capsule Take 20 mg by mouth 2 (two) times daily.        Marland Kitchen oxybutynin (DITROPAN) 5 MG tablet Take 5 mg by mouth 2 (two) times daily.      Marland Kitchen oxyCODONE-acetaminophen (PERCOCET) 10-325 MG per tablet Take 1 tablet by mouth every 6 (six) hours as needed for pain.      . valsartan-hydrochlorothiazide (DIOVAN-HCT) 160-25 MG per tablet Take 1 tablet by mouth daily.         No current  facility-administered medications for this visit.    Past Medical History  Diagnosis Date  . Hypertension   . Tobacco abuse     Past Surgical History  Procedure Laterality Date  . External fixation pelvis  09/10/2011    Procedure: EXTERNAL FIXATION PELVIS;  Surgeon: Vania Rea Supple;  Location: [facility];  Service: Orthopedics;  Laterality: N/A;  . Irrigation and debridement knee  09/10/2011    Procedure: IRRIGATION AND DEBRIDEMENT KNEE;  Surgeon: Vania Rea Supple;  Location: [facility];  Service: Orthopedics;  Laterality: Left;  . Orif pelvic fracture  09/14/2011    Procedure: OPEN REDUCTION INTERNAL FIXATION (ORIF) PELVIC FRACTURE;  Surgeon: Budd Palmer;  Location: [facility];  Service: Orthopedics;  Laterality: N/A;  . Orif ankle fracture  09/14/2011    Procedure: OPEN REDUCTION INTERNAL FIXATION (ORIF) ANKLE FRACTURE;  Surgeon: Budd Palmer;  Location: [facility];  Service: Orthopedics;  Laterality: Right;  . Shoulder arthroscopy  03/25/2012    Procedure: ARTHROSCOPY SHOULDER;  Surgeon: Mable Paris, MD;  Location: [facility];  Service: Orthopedics;  Laterality: Right;  Right Shoulder Arthroscopy with Capsular Release and Manipulation with Debridement of Irreparable Rotator Cuff Tear    History   Social History  . Marital Status: Married    Spouse Name: N/A    Number  of Children: N/A  . Years of Education: N/A   Occupational History  . Not on file.   Social History Main Topics  . Smoking status: Former Smoker    Quit date: 09/25/1987  . Smokeless tobacco: Not on file  . Alcohol Use: No  . Drug Use: No  . Sexual Activity: Not on file   Other Topics Concern  . Not on file   Social History Narrative  . No narrative on file     No family history of premature CAD in 1st degree relatives.  Prior to Admission medications   Medication Sig Start Date End Date Taking? Authorizing Provider  amLODipine (NORVASC) 5 MG tablet Take 5 mg by mouth daily.     Yes  Historical Provider, MD  atorvastatin (LIPITOR) 40 MG tablet Take 40 mg by mouth daily.     Yes Historical Provider, MD  methocarbamol (ROBAXIN) 500 MG tablet Take 500 mg by mouth every 12 (twelve) hours.   Yes Historical Provider, MD  omeprazole (PRILOSEC) 20 MG capsule Take 20 mg by mouth 2 (two) times daily.     Yes Historical Provider, MD  oxybutynin (DITROPAN) 5 MG tablet Take 5 mg by mouth 2 (two) times daily.   Yes Historical Provider, MD  oxyCODONE-acetaminophen (PERCOCET) 10-325 MG per tablet Take 1 tablet by mouth every 6 (six) hours as needed for pain.   Yes Historical Provider, MD  valsartan-hydrochlorothiazide (DIOVAN-HCT) 160-25 MG per tablet Take 1 tablet by mouth daily.     Yes Historical Provider, MD     Review of systems complete and found to be negative unless listed above in HPI     Physical exam Height 5\' 6"  (1.676 m), weight 196 lb 1.9 oz (88.959 kg). General: NAD Neck: No JVD, no thyromegaly or thyroid nodule.  Lungs: Clear to auscultation bilaterally with normal respiratory effort. CV: Nondisplaced PMI.  Heart regular S1/S2, no S3/S4, no murmur.  No peripheral edema.  No carotid bruit.  Normal pedal pulses.  Abdomen: Soft, nontender, no hepatosplenomegaly, no distention.  Skin: Intact without lesions or rashes.  Neurologic: Alert and oriented x 3.  Psych: Normal affect. Extremities: No clubbing or cyanosis.  HEENT: Normal.   Labs:   Lab Results  Component Value Date   WBC 6.9 09/26/2011   HGB 13.9 03/25/2012   HCT 41.0 03/25/2012   MCV 88.8 09/26/2011   PLT 636* 09/26/2011   No results found for this basename: NA, K, CL, CO2, BUN, CREATININE, CALCIUM, LABALBU, PROT, BILITOT, ALKPHOS, ALT, AST, GLUCOSE,  in the last 168 hours Lab Results  Component Value Date   CKTOTAL 495* 09/17/2011   CKMB 1.7 09/17/2011   TROPONINI <0.30 09/17/2011    No results found for this basename: CHOL   No results found for this basename: HDL   No results found for this basename:  LDLCALC   No results found for this basename: TRIG   No results found for this basename: CHOLHDL   No results found for this basename: LDLDIRECT       EKG: Sinus rhythm, rate 84 bpm, incomplete RBBB, QRS 112 ms.  Studies: No results found.  ASSESSMENT AND PLAN:  1. Hypertension: I will increase amlodipine to 10 mg daily. He reportedly had blood tests done at his primary care physician's office. I will obtain the results of those and if a TSH was not checked I will check to see if it is normal. He denies a history of sleep apnea. I have asked  him to bring his blood pressure cuff to our office to make sure it is calibrated appropriately. If so, I have asked him to check his blood pressure 4-5 times per week over the next month. At the moment, I do not see an indication for any noninvasive cardiac testing.  Dispo: f/u one month.   Signed: Prentice DockerSuresh Cedric Mcclaine, M.D., F.A.C.C.  12/07/2013, 8:52 AM

## 2013-12-07 NOTE — Patient Instructions (Addendum)
   Increase Amlodipine to 10mg  daily (may take 2 tabs of your 5mg  tablet till finish current supply) - new sent to pharm Continue all other medications.   Bring blood pressure cuff to next office visit for comparison Office will request most recent labs from Dr. Lysbeth GalasNyland.  If TSH (thyroid) test not done, will need to do so.   Follow up in  1 month

## 2014-01-08 ENCOUNTER — Ambulatory Visit (INDEPENDENT_AMBULATORY_CARE_PROVIDER_SITE_OTHER): Payer: BC Managed Care – PPO | Admitting: Cardiovascular Disease

## 2014-01-08 ENCOUNTER — Encounter: Payer: Self-pay | Admitting: Cardiovascular Disease

## 2014-01-08 VITALS — BP 116/74 | HR 72 | Ht 66.0 in | Wt 194.0 lb

## 2014-01-08 DIAGNOSIS — R5383 Other fatigue: Secondary | ICD-10-CM

## 2014-01-08 DIAGNOSIS — I10 Essential (primary) hypertension: Secondary | ICD-10-CM

## 2014-01-08 DIAGNOSIS — R5381 Other malaise: Secondary | ICD-10-CM

## 2014-01-08 MED ORDER — VALSARTAN-HYDROCHLOROTHIAZIDE 320-25 MG PO TABS: 1.0000 | ORAL_TABLET | Freq: Every day | ORAL | Status: DC

## 2014-01-08 NOTE — Patient Instructions (Signed)
   Increase Valsartan-HCTZ to 320/25mg  daily - new sent to pharm Continue all other medications.   Purchase blood pressure monitor for home use & take BP readings at varied times of the day over the next 2 weeks.  Please return to office for MD review. Follow up in  2 months

## 2014-01-08 NOTE — Progress Notes (Signed)
Patient ID: Leafy HalfJose G Paulson, male   DOB: 1956-06-16, 58 y.o.   MRN: [id number]      SUBJECTIVE: The patient is here to followup for hypertension. While his blood pressure here in the office is normal today, he says that he has checked [place] blood pressure [place] the fire [place] and at work and it has been 142/92 on several occasions. When he wakes up and his blood pressure is in the 140/90 range, he does not feel like going to work but does so anyway. He feels tired by mid day. He denies morning headaches. He has never been tested for sleep apnea.    No Known Allergies  Current Outpatient Prescriptions  Medication Sig Dispense Refill  . amLODipine (NORVASC) 10 MG tablet Take 1 tablet (10 mg total) by mouth daily.  30 tablet  6  . atorvastatin (LIPITOR) 40 MG tablet Take 40 mg by mouth daily.        . methocarbamol (ROBAXIN) 500 MG tablet Take 500 mg by mouth every 12 (twelve) hours.      Marland Kitchen. omeprazole (PRILOSEC) 20 MG capsule Take 20 mg by mouth 2 (two) times daily.        Marland Kitchen. oxybutynin (DITROPAN) 5 MG tablet Take 5 mg by mouth 2 (two) times daily.      Marland Kitchen. oxyCODONE-acetaminophen (PERCOCET) 10-325 MG per tablet Take 1 tablet by mouth every 6 (six) hours as needed for pain.      . valsartan-hydrochlorothiazide (DIOVAN-HCT) 160-25 MG per tablet Take 1 tablet by mouth daily.         No current facility-administered medications for this visit.    Past Medical History  Diagnosis Date  . Hypertension   . Tobacco abuse     Past Surgical History  Procedure Laterality Date  . External fixation pelvis  09/10/2011    Procedure: EXTERNAL FIXATION PELVIS;  Surgeon: Vania ReaKevin M Supple;  [facility];  Service: Orthopedics;  Laterality: N/A;  . Irrigation and debridement knee  09/10/2011    Procedure: IRRIGATION AND DEBRIDEMENT KNEE;  Surgeon: Vania ReaKevin M Supple;  [facility]: [facility];  Service: Orthopedics;  Laterality: Left;  . Orif pelvic fracture  09/14/2011    Procedure: OPEN REDUCTION INTERNAL FIXATION  (ORIF) PELVIC FRACTURE;  Surgeon: Budd PalmerMichael H Handy;  [facility]: [facility];  Service: Orthopedics;  Laterality: N/A;  . Orif ankle fracture  09/14/2011    Procedure: OPEN REDUCTION INTERNAL FIXATION (ORIF) ANKLE FRACTURE;  Surgeon: Budd PalmerMichael H Handy;  [facility]: [facility];  Service: Orthopedics;  Laterality: Right;  . Shoulder arthroscopy  03/25/2012    Procedure: ARTHROSCOPY SHOULDER;  Surgeon: Mable ParisJustin William Chandler, MD;  Location: [facility];  Service: Orthopedics;  Laterality: Right;  Right Shoulder Arthroscopy with Capsular Release and Manipulation with Debridement of Irreparable Rotator Cuff Tear    History   Social History  . Marital Status: Married    Spouse Name: N/A    Number of Children: N/A  . Years of Education: N/A   Occupational History  . Not on file.   Social History Main Topics  . Smoking status: Former Smoker    Quit date: 09/25/1987  . Smokeless tobacco: Never Used  . Alcohol Use: No  . Drug Use: No  . Sexual Activity: Not on file   Other Topics Concern  . Not on file   Social History Narrative  . No narrative on file     Filed Vitals:   01/08/14 0812  BP: 116/74  Pulse: 72  Height: 5\' 6"  (1.676 m)  Weight: 194 lb (87.998 kg)    PHYSICAL EXAM General: NAD Neck: No JVD, no thyromegaly. Lungs: Clear to auscultation bilaterally with normal respiratory effort. CV: Nondisplaced PMI.  Regular rate and rhythm, normal S1/S2, no S3/S4, no murmur. No pretibial or periankle edema.  No carotid bruit.  Normal pedal pulses.  Abdomen: Soft, nontender, no hepatosplenomegaly, no distention.  Neurologic: Alert and oriented x 3.  Psych: Normal affect. Extremities: No clubbing or cyanosis.   ECG: reviewed and available in electronic records.      ASSESSMENT AND PLAN: 1. Hypertension: He plans on purchasing a blood pressure cuff today. I have asked the patient to check blood pressure readings 4-5 times per week, at different times throughout the day, in order  to get a better approximation of mean BP values. These results will be provided to me at the end of that period so that I can determine if further antihypertensive medication titration is indicated. Today, I will increase valsartan to 320 mg daily. I would consider a sleep study in the future, as sleep apnea is an often underdiagnosed and undertreated disease known to exacerbate essential hypertension.  Dispo: f/u 2 months.    Prentice DockerSuresh Meghana Tullo, M.D., F.A.C.C.

## 2014-03-22 ENCOUNTER — Encounter: Payer: Self-pay | Admitting: Cardiovascular Disease

## 2014-03-22 ENCOUNTER — Ambulatory Visit (INDEPENDENT_AMBULATORY_CARE_PROVIDER_SITE_OTHER): Payer: BC Managed Care – PPO | Admitting: Cardiovascular Disease

## 2014-03-22 VITALS — BP 108/72 | HR 80 | Ht 66.0 in | Wt 193.0 lb

## 2014-03-22 DIAGNOSIS — I10 Essential (primary) hypertension: Secondary | ICD-10-CM

## 2014-03-22 DIAGNOSIS — E785 Hyperlipidemia, unspecified: Secondary | ICD-10-CM

## 2014-03-22 NOTE — Progress Notes (Signed)
Patient ID: Leafy HalfJose G Petties, male   DOB: 10-19-1955, 58 y.o.   MRN: [id number]      SUBJECTIVE: The patient is here to followup for essential hypertension. He is feeling very well. He sometimes "feels a little funny" when he's lying underneath a sink and working and then stands up too quickly. However, when checking his blood pressure during these times, it has been 118/72. He denies chest pain, leg swelling, and shortness of breath. He denies daytime somnolence and morning headaches. He is looking into purchasing a new blood pressure cuff because he believes the present one he has is inaccurate.    No Known Allergies  Current Outpatient Prescriptions  Medication Sig Dispense Refill  . amLODipine (NORVASC) 10 MG tablet Take 1 tablet (10 mg total) by mouth daily.  30 tablet  6  . atorvastatin (LIPITOR) 40 MG tablet Take 40 mg by mouth daily.        . methocarbamol (ROBAXIN) 500 MG tablet Take 500 mg by mouth every 12 (twelve) hours.      Marland Kitchen. omeprazole (PRILOSEC) 20 MG capsule Take 20 mg by mouth 2 (two) times daily.        Marland Kitchen. oxybutynin (DITROPAN) 5 MG tablet Take 5 mg by mouth 2 (two) times daily.      Marland Kitchen. oxyCODONE-acetaminophen (PERCOCET) 10-325 MG per tablet Take 1 tablet by mouth every 6 (six) hours as needed for pain.      . valsartan-hydrochlorothiazide (DIOVAN-HCT) 320-25 MG per tablet Take 1 tablet by mouth daily.  30 tablet  6   No current facility-administered medications for this visit.    Past Medical History  Diagnosis Date  . Hypertension   . Tobacco abuse     Past Surgical History  Procedure Laterality Date  . External fixation pelvis  09/10/2011    Procedure: EXTERNAL FIXATION PELVIS;  Surgeon: Vania ReaKevin M Supple;  [facility];  Service: Orthopedics;  Laterality: N/A;  . Irrigation and debridement knee  09/10/2011    Procedure: IRRIGATION AND DEBRIDEMENT KNEE;  Surgeon: Vania ReaKevin M Supple;  [facility];  Service: Orthopedics;  Laterality: Left;  . Orif pelvic fracture   09/14/2011    Procedure: OPEN REDUCTION INTERNAL FIXATION (ORIF) PELVIC FRACTURE;  Surgeon: Budd PalmerMichael H Handy;  [facility];  Service: Orthopedics;  Laterality: N/A;  . Orif ankle fracture  09/14/2011    Procedure: OPEN REDUCTION INTERNAL FIXATION (ORIF) ANKLE FRACTURE;  Surgeon: Budd PalmerMichael H Handy;  [facility];  Service: Orthopedics;  Laterality: Right;  . Shoulder arthroscopy  03/25/2012    Procedure: ARTHROSCOPY SHOULDER;  Surgeon: Mable ParisJustin William Chandler, MD;  Location: [facility];  Service: Orthopedics;  Laterality: Right;  Right Shoulder Arthroscopy with Capsular Release and Manipulation with Debridement of Irreparable Rotator Cuff Tear    History   Social History  . Marital Status: Married    Spouse Name: N/A    Number of Children: N/A  . Years of Education: N/A   Occupational History  . Not on file.   Social History Main Topics  . Smoking status: Former Smoker -- 0.25 packs/day for 20 years    Types: Cigarettes    Start date: 09/02/1968    Quit date: 09/25/1987  . Smokeless tobacco: Never Used  . Alcohol Use: No  . Drug Use: No  . Sexual Activity: Not on file   Other Topics Concern  . Not on file   Social History Narrative  . No narrative on file  Filed Vitals:   03/22/14 0812  BP: 108/72  Pulse: 80  Height: 5\' 6"  (1.676 m)  Weight: 193 lb (87.544 kg)    PHYSICAL EXAM General: NAD Neck: No JVD, no thyromegaly. Lungs: Clear to auscultation bilaterally with normal respiratory effort. CV: Nondisplaced PMI.  Regular rate and rhythm, normal S1/S2, no S3/S4, no murmur. No pretibial or periankle edema.  No carotid bruit.  Normal pedal pulses.  Abdomen: Soft, nontender, no hepatosplenomegaly, no distention.  Neurologic: Alert and oriented x 3.  Psych: Normal affect. Extremities: No clubbing or cyanosis.   ECG: reviewed and available in electronic records.      ASSESSMENT AND PLAN: 1. Essential hypertension: Now well controlled on  amlodipine 10 mg daily along with Diovan-hydrochlorothiazide 320-25 mg daily. 2. Hyperlipidemia: Currently taking Lipitor 40 mg daily. Will check lipids at next visit if not performed by PCP.  Dispo: f/u 6 months.  Prentice DockerSuresh Koneswaran, M.D., F.A.C.C.

## 2014-03-22 NOTE — Patient Instructions (Signed)
Continue all current medications. Your physician wants you to follow up in: 6 months.  You will receive a reminder letter in the mail one-two months in advance.  If you don't receive a letter, please call our office to schedule the follow up appointment   

## 2014-06-28 ENCOUNTER — Other Ambulatory Visit: Payer: Self-pay | Admitting: *Deleted

## 2014-06-28 MED ORDER — AMLODIPINE BESYLATE 10 MG PO TABS: 10.0000 mg | ORAL_TABLET | Freq: Every day | ORAL | Status: DC

## 2014-07-26 ENCOUNTER — Other Ambulatory Visit: Payer: Self-pay | Admitting: *Deleted

## 2014-07-26 MED ORDER — VALSARTAN-HYDROCHLOROTHIAZIDE 320-25 MG PO TABS: 1.0000 | ORAL_TABLET | Freq: Every day | ORAL | Status: DC

## 2014-10-04 ENCOUNTER — Encounter: Payer: Self-pay | Admitting: Cardiology

## 2014-10-04 ENCOUNTER — Telehealth: Payer: Self-pay | Admitting: Cardiovascular Disease

## 2014-10-04 ENCOUNTER — Ambulatory Visit (INDEPENDENT_AMBULATORY_CARE_PROVIDER_SITE_OTHER): Payer: BLUE CROSS/BLUE SHIELD | Admitting: *Deleted

## 2014-10-04 ENCOUNTER — Encounter: Payer: Self-pay | Admitting: *Deleted

## 2014-10-04 DIAGNOSIS — R5383 Other fatigue: Secondary | ICD-10-CM

## 2014-10-04 NOTE — Progress Notes (Signed)
Pt had no complaints when in the office, but was concerned of blurring vision, fatigue, and sweats off and on for 1 week. No complaints of SOB or pain.

## 2014-10-04 NOTE — Telephone Encounter (Signed)
Pt made aware and has contacted Dr. Lysbeth GalasNyland with symptoms

## 2014-10-04 NOTE — Telephone Encounter (Signed)
BP and HR normal. Would have him see PCP.

## 2015-01-10 ENCOUNTER — Other Ambulatory Visit: Payer: Self-pay | Admitting: *Deleted

## 2015-01-10 MED ORDER — VALSARTAN-HYDROCHLOROTHIAZIDE 320-25 MG PO TABS: 1.0000 | ORAL_TABLET | Freq: Every day | ORAL | Status: DC

## 2015-02-22 ENCOUNTER — Other Ambulatory Visit: Payer: Self-pay | Admitting: *Deleted

## 2015-02-22 MED ORDER — VALSARTAN-HYDROCHLOROTHIAZIDE 320-25 MG PO TABS: 1.0000 | ORAL_TABLET | Freq: Every day | ORAL | Status: DC

## 2015-03-31 ENCOUNTER — Telehealth: Payer: Self-pay | Admitting: *Deleted

## 2015-03-31 MED ORDER — VALSARTAN-HYDROCHLOROTHIAZIDE 320-25 MG PO TABS: 1.0000 | ORAL_TABLET | Freq: Every day | ORAL | Status: DC

## 2015-03-31 NOTE — Telephone Encounter (Signed)
Informed pharm they had incorrect fax number.  Correct number given & 15 tabs given with request to contact office for appointment.

## 2015-04-04 ENCOUNTER — Other Ambulatory Visit: Payer: Self-pay | Admitting: *Deleted

## 2015-04-04 MED ORDER — AMLODIPINE BESYLATE 10 MG PO TABS: 10.0000 mg | ORAL_TABLET | Freq: Every day | ORAL | Status: DC

## 2015-07-21 DIAGNOSIS — G4733 Obstructive sleep apnea (adult) (pediatric): Secondary | ICD-10-CM | POA: Insufficient documentation

## 2016-06-29 ENCOUNTER — Ambulatory Visit (INDEPENDENT_AMBULATORY_CARE_PROVIDER_SITE_OTHER): Payer: BLUE CROSS/BLUE SHIELD | Admitting: Physician Assistant

## 2016-06-29 DIAGNOSIS — G894 Chronic pain syndrome: Secondary | ICD-10-CM | POA: Insufficient documentation

## 2016-06-29 MED ORDER — METHOCARBAMOL 500 MG PO TABS: 500.0000 mg | ORAL_TABLET | Freq: Four times a day (QID) | ORAL | 0 refills | Status: DC

## 2016-06-29 MED ORDER — CELECOXIB 200 MG PO CAPS: 200.0000 mg | ORAL_CAPSULE | Freq: Two times a day (BID) | ORAL | 0 refills | Status: DC

## 2016-06-29 NOTE — Progress Notes (Signed)
Patient ID: Theodore Gonzalez, male     DOB: [birth year], 60 y.o.    MRN: [id number]  PCP: Josue HectorNYLAND,LEONARD ROBERT, MD  Chief Complaint  Patient presents with  . Abdominal Pain  . Pelvic Pain  . Shoulder Pain  . Headache  . Medication Refill    oxycodone    Subjective:    HPI  Presents for treatment of pain.  He has chronic pain in the pelvis following an accident in which he sustained a pelvic fracture.  He was taking oxycodone Q4 hours, but pain management (in [place] reduced the oxycodone to Q6 hours and added morphine. Unfortunately, the morphine caused nausea and he could not tolerate it. He returned them to the pain clinic at his last visit about 1 month ago, and they were discarded. However, the oxycodone dose wasn't increased again (to Q4 hours from Q6 hours) to reflect the discontinuation of the morphine.  His pain is severe, and while he's been "pushing through," he is now experiencing pain all over-headache, bilateral shoulders, low back, abdomen, legs. He was not able to work today, and tried calling the pain management office, which is closed today. His next appointment is 07/02/16.    Prior to Admission medications   Medication Sig Start Date End Date Taking? Authorizing Provider  atorvastatin (LIPITOR) 40 MG tablet Take 40 mg by mouth daily.     Yes Historical Provider, MD  escitalopram (LEXAPRO) 10 MG tablet Take by mouth. 02/01/16 01/31/17 Yes Historical Provider, MD  gabapentin (NEURONTIN) 300 MG capsule TAKE 2 CAPSULES AT BEDTIME 04/02/16  Yes Historical Provider, MD  lisinopril (PRINIVIL,ZESTRIL) 5 MG tablet TAKE ONE TABLET (5 MG TOTAL) BY MOUTH DAILY. 03/26/16  Yes Historical Provider, MD  omeprazole (PRILOSEC) 20 MG capsule Take 20 mg by mouth 2 (two) times daily.     Yes Historical Provider, MD  oxyCODONE-acetaminophen (PERCOCET) 10-325 MG per tablet Take 1 tablet by mouth every 6 (six) hours as needed for pain.   Yes Historical Provider, MD  valACYclovir  (VALTREX) 500 MG tablet TAKE 2 TABLETS EVERY DAY 03/19/16  Yes Historical Provider, MD     No Known Allergies   Patient Active Problem List   Diagnosis Date Noted  . Chronic pain syndrome 06/29/2016  . Obstructive sleep apnea syndrome, moderate 07/21/2015  . Hyperlipidemia 12/07/2013  . Inguinal pain of both sides 02/20/2012  . Erectile dysfunction 02/20/2012  . HTN (hypertension) 09/24/2011  . Clostridium difficile colitis 09/22/2011  . Pelvic fracture (HCC) 09/10/2011  . Dislocation of right shoulder joint 09/10/2011  . Dislocation of right ankle joint 09/10/2011  . Closed right ankle fracture 09/10/2011     History reviewed. No pertinent family history.   Social History   Social History  . Marital status: Married    Spouse name: N/A  . Number of children: N/A  . Years of education: N/A   Occupational History  . Not on file.   Social History Main Topics  . Smoking status: Former Smoker    Packs/day: 0.25    Years: 20.00    Types: Cigarettes    Start date: 09/02/1968    Quit date: 09/25/1987  . Smokeless tobacco: Never Used  . Alcohol use No  . Drug use: No  . Sexual activity: Not on file   Other Topics Concern  . Not on file   Social History Narrative   Originally from [place]. Came to the [place] prior to [place].   Lives with his wife.   Adult  children from a previous marriage live independently in [place].        Review of Systems As above. No CP, SOB, dizziness. No urinary changes. No fever, chills. No rash.      Objective:  Physical Exam  Constitutional: He is oriented to person, place, and time. He appears well-developed and well-nourished. He is active and cooperative. No distress.  BP 130/72   Pulse 68   Temp 98.3 F (36.8 C) (Oral)   Resp 16   Ht 5\' 7"  (1.702 m)   Wt 199 lb (90.3 kg)   SpO2 99%   BMI 31.17 kg/m   HENT:  Head: Normocephalic and atraumatic.  Right Ear: Hearing normal.  Left Ear: Hearing normal.  Eyes: Conjunctivae are  normal. No scleral icterus.  Neck: Normal range of motion. Neck supple. No thyromegaly present.  Cardiovascular: Normal rate, regular rhythm and normal heart sounds.   Pulses:      Radial pulses are 2+ on the right side, and 2+ on the left side.  Pulmonary/Chest: Effort normal and breath sounds normal.  Lymphadenopathy:       Head (right side): No tonsillar, no preauricular, no posterior auricular and no occipital adenopathy present.       Head (left side): No tonsillar, no preauricular, no posterior auricular and no occipital adenopathy present.    He has no cervical adenopathy.       Right: No supraclavicular adenopathy present.       Left: No supraclavicular adenopathy present.  Neurological: He is alert and oriented to person, place, and time. No sensory deficit.  Skin: Skin is warm, dry and intact. No rash noted. No cyanosis or erythema. Nails show no clubbing.  Psychiatric: He has a normal mood and affect. His speech is normal and behavior is normal.             Assessment & Plan:  1. Chronic pain syndrome He has previously used methocarbamol with some benefit, but is out. Will restart that today. Also add an anti-inflammatory (celecoxib, given his GERD). He'll continue with the current dose of oxycodone and follow-up with pain management in 3 days as planned. - celecoxib (CELEBREX) 200 MG capsule; Take 1 capsule (200 mg total) by mouth 2 (two) times daily.  Dispense: 60 capsule; Refill: 0 - methocarbamol (ROBAXIN) 500 MG tablet; Take 1 tablet (500 mg total) by mouth 4 (four) times daily.  Dispense: 120 tablet; Refill: 0   Fernande Bras, PA-C Physician Assistant-Certified [facility]

## 2016-06-29 NOTE — Patient Instructions (Signed)
     IF you received an x-ray today, you will receive an invoice from [facility]. Please contact [facility] at [phone] with questions or concerns regarding your invoice.   IF you received labwork today, you will receive an invoice from [facility]. Please contact [facility] at [phone] with questions or concerns regarding your invoice.   Our billing staff will not be able to assist you with questions regarding bills from these companies.  You will be contacted with the lab results as soon as they are available. The fastest way to get your results is to activate your My Chart account. Instructions are located on the last page of this paperwork. If you have not heard from us regarding the results in 2 weeks, please contact this office.      

## 2016-07-02 DIAGNOSIS — K219 Gastro-esophageal reflux disease without esophagitis: Secondary | ICD-10-CM | POA: Insufficient documentation

## 2016-07-02 DIAGNOSIS — F3342 Major depressive disorder, recurrent, in full remission: Secondary | ICD-10-CM | POA: Insufficient documentation

## 2016-07-25 ENCOUNTER — Other Ambulatory Visit: Payer: Self-pay | Admitting: Physician Assistant

## 2016-07-25 DIAGNOSIS — G894 Chronic pain syndrome: Secondary | ICD-10-CM

## 2016-07-26 NOTE — Telephone Encounter (Signed)
06/29/16 last ov and refill

## 2016-07-26 NOTE — Telephone Encounter (Signed)
He sees pain management and has a PCP. Best for refills to come from one of those people. OK to authorize a 30-day supply if he's unable to obtain the refill from his pain specialist or PCP.

## 2016-09-01 ENCOUNTER — Ambulatory Visit (HOSPITAL_COMMUNITY)
Admission: EM | Admit: 2016-09-01 | Discharge: 2016-09-01 | Disposition: A | Discharge status: Home / Self Care | Payer: BLUE CROSS/BLUE SHIELD | Attending: Family Medicine | Admitting: Family Medicine

## 2016-09-01 ENCOUNTER — Encounter (HOSPITAL_COMMUNITY): Payer: Self-pay

## 2016-09-01 DIAGNOSIS — B372 Candidiasis of skin and nail: Secondary | ICD-10-CM | POA: Diagnosis not present

## 2016-09-01 DIAGNOSIS — J069 Acute upper respiratory infection, unspecified: Secondary | ICD-10-CM | POA: Diagnosis not present

## 2016-09-01 MED ORDER — CLOTRIMAZOLE 1 % EX CREA: TOPICAL_CREAM | CUTANEOUS | 0 refills | Status: DC

## 2016-09-01 MED ORDER — IPRATROPIUM BROMIDE 0.06 % NA SOLN: 2.0000 | Freq: Four times a day (QID) | NASAL | 1 refills | Status: DC

## 2016-09-01 MED ORDER — FLUCONAZOLE 150 MG PO TABS
150.0000 mg | ORAL_TABLET | Freq: Once | ORAL | 1 refills | Status: AC
Start: 2016-09-01 — End: 2016-09-01

## 2016-09-01 NOTE — ED Provider Notes (Signed)
[facility]    CSN: [id number] Arrival date & time: 09/01/16  1502     History   Chief Complaint Chief Complaint  Patient presents with  . Rash    HPI Leafy Theodore Gonzalez is a 60 y.o. male.   The history is provided by the patient.  Rash  Location:  Pelvis Pelvic rash location:  Penis Quality: peeling and redness   Severity:  Mild Onset quality:  Gradual Duration:  1 week Progression:  Waxing and waning Chronicity:  Recurrent Associated symptoms: URI   Associated symptoms: no fever     Past Medical History:  Diagnosis Date  . Hypertension   . Tobacco abuse     Patient Active Problem List   Diagnosis Date Noted  . Chronic pain syndrome 06/29/2016  . Obstructive sleep apnea syndrome, moderate 07/21/2015  . Hyperlipidemia 12/07/2013  . Inguinal pain of both sides 02/20/2012  . Erectile dysfunction 02/20/2012  . HTN (hypertension) 09/24/2011  . Clostridium difficile colitis 09/22/2011  . Pelvic fracture (HCC) 09/10/2011  . Dislocation of right shoulder joint 09/10/2011  . Dislocation of right ankle joint 09/10/2011  . Closed right ankle fracture 09/10/2011    Past Surgical History:  Procedure Laterality Date  . EXTERNAL FIXATION PELVIS  09/10/2011   Procedure: EXTERNAL FIXATION PELVIS;  Surgeon: Vania ReaKevin M Supple;  Location: [facility];  Service: Orthopedics;  Laterality: N/A;  . IRRIGATION AND DEBRIDEMENT KNEE  09/10/2011   Procedure: IRRIGATION AND DEBRIDEMENT KNEE;  Surgeon: Vania ReaKevin M Supple;  Location: [facility];  Service: Orthopedics;  Laterality: Left;  . ORIF ANKLE FRACTURE  09/14/2011   Procedure: OPEN REDUCTION INTERNAL FIXATION (ORIF) ANKLE FRACTURE;  Surgeon: Budd PalmerMichael H Handy;  Location: [facility];  Service: Orthopedics;  Laterality: Right;  . ORIF PELVIC FRACTURE  09/14/2011   Procedure: OPEN REDUCTION INTERNAL FIXATION (ORIF) PELVIC FRACTURE;  Surgeon: Budd PalmerMichael H Handy;  [facility]: [facility];  Service: Orthopedics;  Laterality: N/A;  . SHOULDER ARTHROSCOPY   03/25/2012   Procedure: ARTHROSCOPY SHOULDER;  Surgeon: Mable ParisJustin William Chandler, MD;  Location: [facility];  Service: Orthopedics;  Laterality: Right;  Right Shoulder Arthroscopy with Capsular Release and Manipulation with Debridement of Irreparable Rotator Cuff Tear       Home Medications    Prior to Admission medications   Medication Sig Start Date End Date Taking? Authorizing Provider  atorvastatin (LIPITOR) 40 MG tablet Take 40 mg by mouth daily.     Yes Historical Provider, MD  celecoxib (CELEBREX) 200 MG capsule Take 1 capsule (200 mg total) by mouth 2 (two) times daily. 06/29/16  Yes Chelle Jeffery, PA-C  escitalopram (LEXAPRO) 10 MG tablet Take by mouth. 02/01/16 01/31/17 Yes Historical Provider, MD  lisinopril (PRINIVIL,ZESTRIL) 5 MG tablet TAKE ONE TABLET (5 MG TOTAL) BY MOUTH DAILY. 03/26/16  Yes Historical Provider, MD  valACYclovir (VALTREX) 500 MG tablet TAKE 2 TABLETS EVERY DAY 03/19/16  Yes Historical Provider, MD  gabapentin (NEURONTIN) 300 MG capsule TAKE 2 CAPSULES AT BEDTIME 04/02/16   Historical Provider, MD  methocarbamol (ROBAXIN) 500 MG tablet TAKE 1 TABLET BY MOUTH 4 TIMES A DAY 07/27/16   Chelle Jeffery, PA-C  omeprazole (PRILOSEC) 20 MG capsule Take 20 mg by mouth 2 (two) times daily.      Historical Provider, MD  oxyCODONE-acetaminophen (PERCOCET) 10-325 MG per tablet Take 1 tablet by mouth every 6 (six) hours as needed for pain.    Historical Provider, MD    Family History No family history on file.  Social History Social History  Substance Use Topics  . Smoking status: Former Smoker    Packs/day: 0.25    Years: 20.00    Types: Cigarettes    Start date: 09/02/1968    Quit date: 09/25/1987  . Smokeless tobacco: Never Used  . Alcohol use No     Allergies   Patient has no known allergies.   Review of Systems Review of Systems  Constitutional: Negative.  Negative for fever.  HENT: Positive for congestion and postnasal drip.   Skin: Positive  for rash.  All other systems reviewed and are negative.    Physical Exam Triage Vital Signs ED Triage Vitals  Enc Vitals Group     BP 09/01/16 1525 143/77     Pulse Rate 09/01/16 1525 84     Resp 09/01/16 1525 16     Temp 09/01/16 1525 98 F (36.7 C)     Temp Source 09/01/16 1525 Oral     SpO2 09/01/16 1525 97 %     Weight --      Height --      Head Circumference --      Peak Flow --      Pain Score 09/01/16 1527 0     Pain Loc --      Pain Edu? --      Excl. in GC? --    No data found.   Updated Vital Signs BP 143/77 (BP Location: Left Arm)   Pulse 84   Temp 98 F (36.7 C) (Oral)   Resp 16   SpO2 97%   Visual Acuity Right Eye Distance:   Left Eye Distance:   Bilateral Distance:    Right Eye Near:   Left Eye Near:    Bilateral Near:     Physical Exam  Constitutional: He appears well-developed and well-nourished. No distress.  HENT:  Right Ear: External ear normal.  Left Ear: External ear normal.  Mouth/Throat: Oropharynx is clear and moist.  Abdominal: Hernia confirmed negative in the right inguinal area and confirmed negative in the left inguinal area.  Genitourinary: Testes normal. Cremasteric reflex is present. Uncircumcised. Penile erythema present.     Musculoskeletal: Normal range of motion.  Lymphadenopathy: No inguinal adenopathy noted on the right or left side.  Nursing note and vitals reviewed.    UC Treatments / Results  Labs (all labs ordered are listed, but only abnormal results are displayed) Labs Reviewed - No data to display  EKG  EKG Interpretation None       Radiology No results found.  Procedures Procedures (including critical care time)  Medications Ordered in UC Medications - No data to display   Initial Impression / Assessment and Plan / UC Course  I have reviewed the triage vital signs and the nursing notes.  Pertinent labs & imaging results that were available during my care of the patient were reviewed by  me and considered in my medical decision making (see chart for details).  Clinical Course       Final Clinical Impressions(s) / UC Diagnoses   Final diagnoses:  None    New Prescriptions New Prescriptions   No medications on file     Linna HoffJames D Makenzee Choudhry, MD 09/01/16 1549

## 2016-09-01 NOTE — ED Triage Notes (Signed)
Pt has a rash in his groin area, has taking the valtrex but feels like it has gone away completely and thinks he may need something stronger. This medication was prescribed by his pcp but they are unable to see him for a week. Also said he has a cold.

## 2016-09-01 NOTE — Discharge Instructions (Signed)
Drink plenty of fluids as discussed, use medicine as prescribed, and mucinex or delsym for cough. Return or see your doctor if further problems °

## 2016-09-03 ENCOUNTER — Telehealth (HOSPITAL_COMMUNITY): Payer: Self-pay | Admitting: Emergency Medicine

## 2016-09-03 MED ORDER — VALACYCLOVIR HCL 500 MG PO TABS: 500.0000 mg | ORAL_TABLET | Freq: Two times a day (BID) | ORAL | 3 refills | Status: DC

## 2016-09-03 NOTE — Telephone Encounter (Signed)
Pt called and was upset... States he was seen here on 09/01/16 and saw Dr. Artis FlockKindl...   He states he told provider that he had herpes break out and needed refill on his valacyclovir but instead was treated for candidal dermatitis and was given clotrimazole, fluconazole  Per Dr. Milus GlazierLauenstein, ok to call in his valacyclovir 500 mg BID x4 days w/3 refills   Per pt's request, called med to [facility]([address])

## 2017-06-27 DIAGNOSIS — Z9889 Other specified postprocedural states: Secondary | ICD-10-CM | POA: Diagnosis not present

## 2017-06-27 DIAGNOSIS — M25572 Pain in left ankle and joints of left foot: Secondary | ICD-10-CM | POA: Diagnosis not present

## 2017-06-27 DIAGNOSIS — I10 Essential (primary) hypertension: Secondary | ICD-10-CM | POA: Diagnosis not present

## 2017-06-27 DIAGNOSIS — G8929 Other chronic pain: Secondary | ICD-10-CM | POA: Diagnosis not present

## 2017-06-27 DIAGNOSIS — G894 Chronic pain syndrome: Secondary | ICD-10-CM | POA: Diagnosis not present

## 2017-07-18 DIAGNOSIS — Z23 Encounter for immunization: Secondary | ICD-10-CM | POA: Diagnosis not present

## 2017-08-08 DIAGNOSIS — I10 Essential (primary) hypertension: Secondary | ICD-10-CM | POA: Diagnosis not present

## 2017-08-08 DIAGNOSIS — F324 Major depressive disorder, single episode, in partial remission: Secondary | ICD-10-CM | POA: Diagnosis not present

## 2017-08-08 DIAGNOSIS — E559 Vitamin D deficiency, unspecified: Secondary | ICD-10-CM | POA: Diagnosis not present

## 2017-08-08 DIAGNOSIS — K219 Gastro-esophageal reflux disease without esophagitis: Secondary | ICD-10-CM | POA: Diagnosis not present

## 2017-08-08 DIAGNOSIS — R5383 Other fatigue: Secondary | ICD-10-CM | POA: Diagnosis not present

## 2017-08-09 DIAGNOSIS — K219 Gastro-esophageal reflux disease without esophagitis: Secondary | ICD-10-CM | POA: Diagnosis not present

## 2017-08-09 DIAGNOSIS — F324 Major depressive disorder, single episode, in partial remission: Secondary | ICD-10-CM | POA: Diagnosis not present

## 2017-08-09 DIAGNOSIS — E559 Vitamin D deficiency, unspecified: Secondary | ICD-10-CM | POA: Diagnosis not present

## 2017-08-09 DIAGNOSIS — R5383 Other fatigue: Secondary | ICD-10-CM | POA: Diagnosis not present

## 2017-08-09 DIAGNOSIS — N529 Male erectile dysfunction, unspecified: Secondary | ICD-10-CM | POA: Diagnosis not present

## 2017-08-28 DIAGNOSIS — M25572 Pain in left ankle and joints of left foot: Secondary | ICD-10-CM | POA: Diagnosis not present

## 2017-08-28 DIAGNOSIS — Z9889 Other specified postprocedural states: Secondary | ICD-10-CM | POA: Diagnosis not present

## 2017-08-28 DIAGNOSIS — G8929 Other chronic pain: Secondary | ICD-10-CM | POA: Diagnosis not present

## 2017-08-28 DIAGNOSIS — G894 Chronic pain syndrome: Secondary | ICD-10-CM | POA: Diagnosis not present

## 2017-09-18 DIAGNOSIS — R55 Syncope and collapse: Secondary | ICD-10-CM | POA: Diagnosis not present

## 2017-09-18 DIAGNOSIS — R42 Dizziness and giddiness: Secondary | ICD-10-CM | POA: Diagnosis not present

## 2017-10-18 DIAGNOSIS — R42 Dizziness and giddiness: Secondary | ICD-10-CM | POA: Diagnosis not present

## 2017-10-18 DIAGNOSIS — I10 Essential (primary) hypertension: Secondary | ICD-10-CM | POA: Diagnosis not present

## 2017-10-18 DIAGNOSIS — E785 Hyperlipidemia, unspecified: Secondary | ICD-10-CM | POA: Diagnosis not present

## 2017-10-18 DIAGNOSIS — R0609 Other forms of dyspnea: Secondary | ICD-10-CM | POA: Diagnosis not present

## 2017-10-23 DIAGNOSIS — G894 Chronic pain syndrome: Secondary | ICD-10-CM | POA: Diagnosis not present

## 2017-10-23 DIAGNOSIS — M25572 Pain in left ankle and joints of left foot: Secondary | ICD-10-CM | POA: Diagnosis not present

## 2017-10-23 DIAGNOSIS — Z9889 Other specified postprocedural states: Secondary | ICD-10-CM | POA: Diagnosis not present

## 2017-10-23 DIAGNOSIS — G8929 Other chronic pain: Secondary | ICD-10-CM | POA: Diagnosis not present

## 2017-12-12 DIAGNOSIS — G4733 Obstructive sleep apnea (adult) (pediatric): Secondary | ICD-10-CM | POA: Diagnosis not present

## 2017-12-18 DIAGNOSIS — Z9889 Other specified postprocedural states: Secondary | ICD-10-CM | POA: Diagnosis not present

## 2017-12-18 DIAGNOSIS — Z5181 Encounter for therapeutic drug level monitoring: Secondary | ICD-10-CM | POA: Diagnosis not present

## 2017-12-18 DIAGNOSIS — I10 Essential (primary) hypertension: Secondary | ICD-10-CM | POA: Diagnosis not present

## 2017-12-18 DIAGNOSIS — Z79899 Other long term (current) drug therapy: Secondary | ICD-10-CM | POA: Diagnosis not present

## 2017-12-18 DIAGNOSIS — M25572 Pain in left ankle and joints of left foot: Secondary | ICD-10-CM | POA: Diagnosis not present

## 2017-12-18 DIAGNOSIS — T07XXXA Unspecified multiple injuries, initial encounter: Secondary | ICD-10-CM | POA: Diagnosis not present

## 2017-12-18 DIAGNOSIS — G894 Chronic pain syndrome: Secondary | ICD-10-CM | POA: Diagnosis not present

## 2017-12-23 DIAGNOSIS — K529 Noninfective gastroenteritis and colitis, unspecified: Secondary | ICD-10-CM | POA: Diagnosis not present

## 2017-12-23 DIAGNOSIS — I10 Essential (primary) hypertension: Secondary | ICD-10-CM | POA: Diagnosis not present

## 2018-01-22 DIAGNOSIS — Z9889 Other specified postprocedural states: Secondary | ICD-10-CM | POA: Diagnosis not present

## 2018-01-22 DIAGNOSIS — G8929 Other chronic pain: Secondary | ICD-10-CM | POA: Diagnosis not present

## 2018-01-22 DIAGNOSIS — M25572 Pain in left ankle and joints of left foot: Secondary | ICD-10-CM | POA: Diagnosis not present

## 2018-01-22 DIAGNOSIS — G894 Chronic pain syndrome: Secondary | ICD-10-CM | POA: Diagnosis not present

## 2018-02-18 DIAGNOSIS — R0609 Other forms of dyspnea: Secondary | ICD-10-CM | POA: Diagnosis not present

## 2018-02-26 DIAGNOSIS — E7800 Pure hypercholesterolemia, unspecified: Secondary | ICD-10-CM | POA: Diagnosis not present

## 2018-02-26 DIAGNOSIS — Z713 Dietary counseling and surveillance: Secondary | ICD-10-CM | POA: Diagnosis not present

## 2018-02-26 DIAGNOSIS — E669 Obesity, unspecified: Secondary | ICD-10-CM | POA: Diagnosis not present

## 2018-03-19 DIAGNOSIS — M25572 Pain in left ankle and joints of left foot: Secondary | ICD-10-CM | POA: Diagnosis not present

## 2018-03-19 DIAGNOSIS — G894 Chronic pain syndrome: Secondary | ICD-10-CM | POA: Diagnosis not present

## 2018-03-19 DIAGNOSIS — Z9889 Other specified postprocedural states: Secondary | ICD-10-CM | POA: Diagnosis not present

## 2018-03-19 DIAGNOSIS — G8929 Other chronic pain: Secondary | ICD-10-CM | POA: Diagnosis not present

## 2018-03-19 DIAGNOSIS — Z79899 Other long term (current) drug therapy: Secondary | ICD-10-CM | POA: Diagnosis not present

## 2018-03-19 DIAGNOSIS — Z5181 Encounter for therapeutic drug level monitoring: Secondary | ICD-10-CM | POA: Diagnosis not present

## 2018-04-30 DIAGNOSIS — R42 Dizziness and giddiness: Secondary | ICD-10-CM | POA: Diagnosis not present

## 2018-04-30 DIAGNOSIS — I10 Essential (primary) hypertension: Secondary | ICD-10-CM | POA: Diagnosis not present

## 2018-05-16 DIAGNOSIS — F324 Major depressive disorder, single episode, in partial remission: Secondary | ICD-10-CM | POA: Diagnosis not present

## 2018-05-16 DIAGNOSIS — I10 Essential (primary) hypertension: Secondary | ICD-10-CM | POA: Diagnosis not present

## 2018-05-23 DIAGNOSIS — G894 Chronic pain syndrome: Secondary | ICD-10-CM | POA: Diagnosis not present

## 2018-05-23 DIAGNOSIS — G8929 Other chronic pain: Secondary | ICD-10-CM | POA: Diagnosis not present

## 2018-05-23 DIAGNOSIS — Z9889 Other specified postprocedural states: Secondary | ICD-10-CM | POA: Diagnosis not present

## 2018-05-23 DIAGNOSIS — M25572 Pain in left ankle and joints of left foot: Secondary | ICD-10-CM | POA: Diagnosis not present

## 2018-06-05 DIAGNOSIS — N5201 Erectile dysfunction due to arterial insufficiency: Secondary | ICD-10-CM | POA: Diagnosis not present

## 2018-06-10 DIAGNOSIS — Z23 Encounter for immunization: Secondary | ICD-10-CM | POA: Diagnosis not present

## 2018-06-26 DIAGNOSIS — N5201 Erectile dysfunction due to arterial insufficiency: Secondary | ICD-10-CM | POA: Diagnosis not present

## 2018-07-10 DIAGNOSIS — M7741 Metatarsalgia, right foot: Secondary | ICD-10-CM | POA: Diagnosis not present

## 2018-07-10 DIAGNOSIS — G5790 Unspecified mononeuropathy of unspecified lower limb: Secondary | ICD-10-CM | POA: Diagnosis not present

## 2018-07-10 DIAGNOSIS — M79671 Pain in right foot: Secondary | ICD-10-CM | POA: Diagnosis not present

## 2018-07-10 DIAGNOSIS — M79672 Pain in left foot: Secondary | ICD-10-CM | POA: Diagnosis not present

## 2018-07-21 DIAGNOSIS — Z9889 Other specified postprocedural states: Secondary | ICD-10-CM | POA: Diagnosis not present

## 2018-07-21 DIAGNOSIS — G8929 Other chronic pain: Secondary | ICD-10-CM | POA: Diagnosis not present

## 2018-07-21 DIAGNOSIS — G894 Chronic pain syndrome: Secondary | ICD-10-CM | POA: Diagnosis not present

## 2018-07-21 DIAGNOSIS — M25572 Pain in left ankle and joints of left foot: Secondary | ICD-10-CM | POA: Diagnosis not present

## 2018-07-28 DIAGNOSIS — H903 Sensorineural hearing loss, bilateral: Secondary | ICD-10-CM | POA: Diagnosis not present

## 2018-07-28 DIAGNOSIS — R42 Dizziness and giddiness: Secondary | ICD-10-CM | POA: Diagnosis not present

## 2018-09-18 DIAGNOSIS — M25572 Pain in left ankle and joints of left foot: Secondary | ICD-10-CM | POA: Diagnosis not present

## 2018-09-18 DIAGNOSIS — Z5181 Encounter for therapeutic drug level monitoring: Secondary | ICD-10-CM | POA: Diagnosis not present

## 2018-09-18 DIAGNOSIS — Z9889 Other specified postprocedural states: Secondary | ICD-10-CM | POA: Diagnosis not present

## 2018-09-18 DIAGNOSIS — G894 Chronic pain syndrome: Secondary | ICD-10-CM | POA: Diagnosis not present

## 2018-09-18 DIAGNOSIS — Z79899 Other long term (current) drug therapy: Secondary | ICD-10-CM | POA: Diagnosis not present

## 2018-09-19 DIAGNOSIS — G4733 Obstructive sleep apnea (adult) (pediatric): Secondary | ICD-10-CM | POA: Diagnosis not present

## 2018-09-23 DIAGNOSIS — M79671 Pain in right foot: Secondary | ICD-10-CM | POA: Diagnosis not present

## 2018-09-23 DIAGNOSIS — M7741 Metatarsalgia, right foot: Secondary | ICD-10-CM | POA: Diagnosis not present

## 2018-09-23 DIAGNOSIS — M7742 Metatarsalgia, left foot: Secondary | ICD-10-CM | POA: Diagnosis not present

## 2018-09-23 DIAGNOSIS — M79672 Pain in left foot: Secondary | ICD-10-CM | POA: Diagnosis not present

## 2018-10-30 DIAGNOSIS — J209 Acute bronchitis, unspecified: Secondary | ICD-10-CM | POA: Diagnosis not present

## 2018-10-30 DIAGNOSIS — E669 Obesity, unspecified: Secondary | ICD-10-CM | POA: Diagnosis not present

## 2018-10-30 DIAGNOSIS — E7800 Pure hypercholesterolemia, unspecified: Secondary | ICD-10-CM | POA: Diagnosis not present

## 2018-10-30 DIAGNOSIS — Z713 Dietary counseling and surveillance: Secondary | ICD-10-CM | POA: Diagnosis not present

## 2018-11-06 DIAGNOSIS — I10 Essential (primary) hypertension: Secondary | ICD-10-CM | POA: Diagnosis not present

## 2018-11-13 DIAGNOSIS — G894 Chronic pain syndrome: Secondary | ICD-10-CM | POA: Diagnosis not present

## 2018-11-13 DIAGNOSIS — G8929 Other chronic pain: Secondary | ICD-10-CM | POA: Diagnosis not present

## 2018-11-13 DIAGNOSIS — M25572 Pain in left ankle and joints of left foot: Secondary | ICD-10-CM | POA: Diagnosis not present

## 2018-11-13 DIAGNOSIS — Z9889 Other specified postprocedural states: Secondary | ICD-10-CM | POA: Diagnosis not present

## 2019-01-15 DIAGNOSIS — G8929 Other chronic pain: Secondary | ICD-10-CM | POA: Diagnosis not present

## 2019-01-15 DIAGNOSIS — Z9889 Other specified postprocedural states: Secondary | ICD-10-CM | POA: Diagnosis not present

## 2019-01-15 DIAGNOSIS — G894 Chronic pain syndrome: Secondary | ICD-10-CM | POA: Diagnosis not present

## 2019-01-15 DIAGNOSIS — M25572 Pain in left ankle and joints of left foot: Secondary | ICD-10-CM | POA: Diagnosis not present

## 2019-03-17 DIAGNOSIS — M25572 Pain in left ankle and joints of left foot: Secondary | ICD-10-CM | POA: Diagnosis not present

## 2019-03-17 DIAGNOSIS — G894 Chronic pain syndrome: Secondary | ICD-10-CM | POA: Diagnosis not present

## 2019-03-19 DIAGNOSIS — I10 Essential (primary) hypertension: Secondary | ICD-10-CM | POA: Diagnosis not present

## 2019-03-19 DIAGNOSIS — E785 Hyperlipidemia, unspecified: Secondary | ICD-10-CM | POA: Diagnosis not present

## 2019-03-23 DIAGNOSIS — Z1211 Encounter for screening for malignant neoplasm of colon: Secondary | ICD-10-CM | POA: Diagnosis not present

## 2019-04-01 ENCOUNTER — Other Ambulatory Visit: Payer: Self-pay

## 2019-04-02 ENCOUNTER — Other Ambulatory Visit: Payer: Self-pay

## 2019-04-03 ENCOUNTER — Ambulatory Visit: Payer: BC Managed Care – PPO | Admitting: Family Medicine

## 2019-04-03 ENCOUNTER — Encounter: Payer: Self-pay | Admitting: Family Medicine

## 2019-04-03 VITALS — BP 110/70 | HR 71 | Temp 98.2°F | Ht 67.0 in | Wt 211.0 lb

## 2019-04-03 DIAGNOSIS — K219 Gastro-esophageal reflux disease without esophagitis: Secondary | ICD-10-CM | POA: Diagnosis not present

## 2019-04-03 DIAGNOSIS — Z7689 Persons encountering health services in other specified circumstances: Secondary | ICD-10-CM | POA: Diagnosis not present

## 2019-04-03 DIAGNOSIS — R195 Other fecal abnormalities: Secondary | ICD-10-CM

## 2019-04-03 DIAGNOSIS — K4020 Bilateral inguinal hernia, without obstruction or gangrene, not specified as recurrent: Secondary | ICD-10-CM | POA: Diagnosis not present

## 2019-04-03 MED ORDER — OMEPRAZOLE 20 MG PO CPDR: 20.0000 mg | DELAYED_RELEASE_CAPSULE | Freq: Every day | ORAL | 3 refills | Status: DC

## 2019-04-03 NOTE — Patient Instructions (Addendum)
If you hear nothing regarding an appointment by next Friday, call me.   Hernia, Adult     A hernia is the bulging of an organ or tissue through a weak spot in the muscles of the abdomen (abdominal wall). Hernias develop most often near the belly button (navel) or the area where the leg meets the lower abdomen (groin). Common types of hernias include:  Incisional hernia. This type bulges through a scar from an abdominal surgery.  Umbilical hernia. This type develops near the navel.  Inguinal hernia. This type develops in the groin or scrotum.  Femoral hernia. This type develops under the groin, in the upper thigh area.  Hiatal hernia. This type occurs when part of the stomach slides above the muscle that separates the abdomen from the chest (diaphragm). What are the causes? This condition may be caused by:  Heavy lifting.  Coughing over a long period of time.  Straining to have a bowel movement. Constipation can lead to straining.  An incision made during an abdominal surgery.  A physical problem that is present at birth (congenital defect).  Being overweight or obese.  Smoking.  Excess fluid in the abdomen.  Undescended testicles in males. What are the signs or symptoms? The main symptom is a skin-colored, rounded bulge in the area of the hernia. However, a bulge may not always be present. It may grow bigger or be more visible when you cough or strain (such as when lifting something heavy). A hernia that can be pushed back into the area (is reducible) rarely causes pain. A hernia that cannot be pushed back into the area (is incarcerated) may lose its blood supply (become strangulated). A hernia that is incarcerated may cause:  Pain.  Fever.  Nausea and vomiting.  Swelling.  Constipation. How is this diagnosed? A hernia may be diagnosed based on:  Your symptoms and medical history.  A physical exam. Your health care provider may ask you to cough or move in  certain ways to see if the hernia becomes visible.  Imaging tests, such as: ? X-rays. ? Ultrasound. ? CT scan. How is this treated? A hernia that is small and painless may not need to be treated. A hernia that is large or painful may be treated with surgery. Inguinal hernias may be treated with surgery to prevent incarceration or strangulation. Strangulated hernias are always treated with surgery because a lack of blood supply to the trapped organ or tissue can cause it to die. Surgery to treat a hernia involves pushing the bulge back into place and repairing the weak area of the muscle or abdominal wall. Follow these instructions at home: Activity  Avoid straining.  Do not lift anything that is heavier than 10 lb (4.5 kg), or the limit that you are told, until your health care provider says that it is safe.  When lifting heavy objects, lift with your leg muscles, not your back muscles. Preventing constipation  Take actions to prevent constipation. Constipation leads to straining with bowel movements, which can make a hernia worse or cause a hernia repair to break down. Your health care provider may recommend that you: ? Drink enough fluid to keep your urine pale yellow. ? Eat foods that are high in fiber, such as fresh fruits and vegetables, whole grains, and beans. ? Limit foods that are high in fat and processed sugars, such as fried or sweet foods. ? Take an over-the-counter or prescription medicine for constipation. General instructions  When coughing, try  to cough gently.  You may try to push the hernia back in place by very gently pressing on it while lying down. Do not try to force the bulge back in if it will not push in easily.  If you are overweight, work with your health care provider to lose weight safely.  Do not use any products that contain nicotine or tobacco, such as cigarettes and e-cigarettes. If you need help quitting, ask your health care provider.  If you are  scheduled for hernia repair, watch your hernia for any changes in shape, size, or color. Tell your health care provider about any changes or new symptoms.  Take over-the-counter and prescription medicines only as told by your health care provider.  Keep all follow-up visits as told by your health care provider. This is important. Contact a health care provider if:  You develop new pain, swelling, or redness around your hernia.  You have signs of constipation, such as: ? Fewer bowel movements in a week than normal. ? Difficulty having a bowel movement. ? Stools that are dry, hard, or larger than normal. Get help right away if:  You have a fever.  You have abdomen pain that gets worse.  You feel nauseous or you vomit.  You cannot push the hernia back in place by very gently pressing on it while lying down. Do not try to force the bulge back in if it will not push in easily.  The hernia: ? Changes in shape, size, or color. ? Feels hard or tender. These symptoms may represent a serious problem that is an emergency. Do not wait to see if the symptoms will go away. Get medical help right away. Call your local emergency services (911 in the [place] Summary  A hernia is the bulging of an organ or tissue through a weak spot in the muscles of the abdomen (abdominal wall).  The main symptom is a skin-colored, rounded lump (bulge) in the hernia area. However, a bulge may not always be present. It may grow bigger or more visible when you cough or strain (such as when having a bowel movement).  A hernia that is small and painless may not need to be treated. A hernia that is large or painful may be treated with surgery.  Surgery to treat a hernia involves pushing the bulge back into place and repairing the weak part of the abdomen. This information is not intended to replace advice given to you by your health care provider. Make sure you discuss any questions you have with your health care  provider. Document Released: 09/10/2005 Document Revised: 01/01/2019 Document Reviewed: 06/12/2017 Elsevier Patient Education  2020 [place], en adultos Hernia, Adult     Una hernia es la protrusin de un rgano o un tejido a travs de un punto dbil en los msculos del abdomen (pared abdominal). Las hernias se desarrollan con ms frecuencia cerca del ombligo o en la zona donde se unen las piernas con el abdomen inferior (ingle). Los tipos ms comunes de hernias incluyen:  Hernia incisional. Este tipo sobresale a travs de una cicatriz de una ciruga abdominal.  Hernia umbilical. Este tipo se desarrolla cerca del ombligo.  Hernia inguinal. Este tipo de hernia aparece en la ingle o en el escroto.  Hernia crural. Este tipo de hernia aparece por debajo de la ingle en la regin superior del muslo.  Hernia de hiato. Se produce cuando parte del estmago se desliza por arriba del msculo que separa  el abdomen del trax (diafragma). Cules son las causas? Esta afeccin puede ser causada por lo siguiente:  Levantar peso excesivo.  Toser FedExdurante mucho tiempo.  Hacer un gran esfuerzo para defecar. El estreimiento puede llevar a Comptrollertener que hacer un gran esfuerzo.  Una incisin realizada durante una ciruga abdominal.  Un problema fsico presente desde el nacimiento (defecto congnito).  Tener exceso de Ishpemingpeso u obesidad.  Fumar.  Exceso de lquido en el abdomen.  Falta de descenso de los Genuine Partstestculos en los hombres. Cules son los signos o sntomas? El sntoma principal es un bulto color piel y redondeado en la zona de la hernia. Sin embargo, el bulto no siempre es visible. Puede hacerse ms grande o ms visible al toser o Herbalisthacer un esfuerzo (como cuando se levanta un objeto pesado). Una hernia que se puede empujar hacia Photographeradentro en el lugar (es reducible) rara vez causa dolor. Una hernia que no se puede empujar IT consultanthacia adentro en el lugar (encarcelada) puede perder irrigacin  sangunea (estrangularse). Una hernia que est encarcelada puede causar lo siguiente:  Dolor.  Grant RutsFiebre.  Nuseas y vmitos.  Hinchazn.  Estreimiento. Cmo se diagnostica? La hernia puede diagnosticarse en funcin de lo siguiente:  Los sntomas y antecedentes mdicos.  Un examen fsico. El mdico puede pedirle que tosa o que se mueva de ciertas maneras para controlar si la hernia se hace visible.  Pruebas de diagnstico por imgenes, por ejemplo: ? Radiografas. ? Ecografa. ? Exploracin por tomografa computarizada (TC). Cmo se trata? Es posible que una hernia pequea e indolora no necesite tratamiento. Una hernia grande o dolorosa puede tratarse con Azerbaijanciruga. Para tratar las hernias inguinales, se puede recurrir a [place] Herz[place]una ciruga para Teacher, early years/preevitar el encarcelamiento o la estrangulacin. Las hernias estranguladas siempre se tratan con ciruga porque la falta de irrigacin sangunea al rgano o al tejido atrapados puede causar su muerte. La ciruga para tratar una hernia incluye volver a Scientist, product/process developmentcolocar el bulto en su lugar y reparar la zona dbil del msculo o de la pared abdominal. Siga estas indicaciones en su casa: Actividad  No haga esfuerzos.  No levante ningn objeto que pese ms de 10libras (4,5kg) o el lmite de peso que le haya indicado el mdico hasta que l le diga que es seguro [place]acerlo.  Al levantar objetos pesados, use los msculos de las piernas, no los de la espalda. Prevencin del estreimiento  Tome medidas para Chief Strategy Officerevitar el estreimiento. El estreimiento obliga a Education officer, environmentalrealizar esfuerzos para Advertising copywriterdefecar, los cuales pueden agravar una hernia o causar la rotura de la reparacin. El mdico podra recomendarle que haga lo siguiente: ? Beba suficiente lquido para mantener la orina de color amarillo plido. ? Consuma alimentos ricos en fibra, como frutas y verduras frescas, cereales integrales y frijoles. ? Limite el consumo de alimentos ricos en grasa y azcares procesados, como  alimentos fritos o dulces. ? Tome medicamentos recetados o de venta libre para el estreimiento. Instrucciones generales  Cuando tosa, hgalo con suavidad.  Puede intentar colocar la hernia en su lugar ejerciendo sobre esta una presin muy suave mientras est acostado. No intente forzar el bulto hacia adentro nuevamente si este no entra fcilmente.  Si tiene sobrepeso, trabaje con el mdico para adelgazar sin riegos.  No consuma ningn producto que contenga nicotina o tabaco, como cigarrillos y Administrator, Civil Servicecigarrillos electrnicos. Si necesita ayuda para dejar de fumar, consulte al American Expressmdico.  Si tiene programada la reparacin de una hernia, controle la hernia para detectar cualquier cambio en la forma, el tamao o  el color. Informe al mdico sobre cualquier cambio o sntoma nuevo.  Tome los medicamentos de venta libre y los recetados solamente como se lo haya indicado el mdico.  OceanographerConcurra a todas las visitas de control como se lo haya indicado el mdico. Esto es importante. Comunquese con un mdico si:  Hay dolor, hinchazn o enrojecimiento alrededor de la hernia.  Presenta signos de estreimiento, como los siguientes: ? Defeca menos que lo normal durante una semana. ? Tiene dificultades para defecar. ? Tiene las heces secas y duras, o ms grandes que lo normal. Solicite ayuda de inmediato si:  Tiene fiebre.  Tiene un dolor abdominal que empeora.  Siente nuseas o vomita.  No puede colocar la hernia en su lugar ejerciendo sobre esta una presin muy suave mientras est acostado. No intente forzar el bulto hacia adentro nuevamente si este no entra fcilmente.  La hernia: ? Cambia de forma, tamao o color. ? Est dura al tacto o causa dolor a la palpacin. Estos sntomas pueden representar un problema grave que constituye Radio broadcast assistantuna emergencia. No espere hasta que los sntomas desaparezcan. Solicite atencin mdica de inmediato. Comunquese con el servicio de emergencias de su localidad (911 en los  [place]). Resumen  Una hernia es la protrusin de un rgano o un tejido a travs de un punto dbil en los msculos del abdomen (pared abdominal).  El sntoma principal es un bulto color piel y redondeado (protuberancia) en la zona de la hernia. Sin embargo, el bulto no siempre es visible. Puede hacerse ms grande o ms visible al toser o hace un esfuerzo (como cuando Scientist, clinical (histocompatibility and immunogenetics)intenta defecar).  Es posible que una hernia pequea e indolora no necesite tratamiento. Una hernia grande o dolorosa puede tratarse con Azerbaijanciruga.  La ciruga para tratar una hernia incluye volver a Scientist, product/process developmentcolocar el bulto en su lugar y reparar la zona dbil del abdomen. Esta informacin no tiene Theme park managercomo fin reemplazar el consejo del mdico. Asegrese de hacerle al mdico cualquier pregunta que tenga. Document Released: 09/10/2005 Document Revised: 12/11/2017 Document Reviewed: 07/31/2017 Elsevier Patient Education  2020 [employer]

## 2019-04-03 NOTE — Progress Notes (Signed)
Subjective: ZO:XWRUEAVWUCC:establish care, hernias HPI: Leafy HalfJose G Gonzalez is a 63 y.o. male presenting to clinic today for:  1.  Hernia Patient reports bilateral hernia that were noted on a recent exam.  His PCP recently retired.  He is here for referral to general surgery to have these fixed.  He denies any constipation, nausea, vomiting or severe pain.  He does note that the bulges seem to be worse with sitting up or standing.  They are relieved by lying down.  He feels that the right side is worse than the left side.  He does have history of significant pelvic fracture in the past and is on chronic pain medication as a result.  He sees a pain specialist.  2.  GERD Patient reports history of GERD previously treated with omeprazole.  He has been out of his omeprazole for a while [place] would like to restart this.  He denies any nausea, vomiting or abdominal pain.  He does report history of positive FOBT and was told by his previous provider that this should be rechecked.  This was noted to be positive per his records and care everywhere on 03/23/2019.  He does want to get another colonoscopy but wants to put the hernia surgery as a priority.  He would like to simply bring home another FOBT to recheck this.  Denies any frank blood in the stool.  Past Medical History:  Diagnosis Date  . Hypertension   . Tobacco abuse    Past Surgical History:  Procedure Laterality Date  . EXTERNAL FIXATION PELVIS  09/10/2011   Procedure: EXTERNAL FIXATION PELVIS;  Surgeon: Theodore Gonzalez;  Location: [facility] OR;  Service: Orthopedics;  Laterality: N/A;  . IRRIGATION AND DEBRIDEMENT KNEE  09/10/2011   Procedure: IRRIGATION AND DEBRIDEMENT KNEE;  Surgeon: Theodore Gonzalez;  Location: [facility] OR;  Service: Orthopedics;  Laterality: Left;  . ORIF ANKLE FRACTURE  09/14/2011   Procedure: OPEN REDUCTION INTERNAL FIXATION (ORIF) ANKLE FRACTURE;  Surgeon: Theodore Gonzalez;  Location: [facility] OR;  Service: Orthopedics;  Laterality: Right;  . ORIF PELVIC  FRACTURE  09/14/2011   Procedure: OPEN REDUCTION INTERNAL FIXATION (ORIF) PELVIC FRACTURE;  Surgeon: Theodore Gonzalez;  Location: [facility] OR;  Service: Orthopedics;  Laterality: N/A;  . SHOULDER ARTHROSCOPY  03/25/2012   Procedure: ARTHROSCOPY SHOULDER;  Surgeon: Theodore ParisJustin William Chandler, MD;  Location: [facility];  Service: Orthopedics;  Laterality: Right;  Right Shoulder Arthroscopy with Capsular Release and Manipulation with Debridement of Irreparable Rotator Cuff Tear   Social History   Socioeconomic History  . Marital status: Married    Spouse name: Not on file  . Number of children: Not on file  . Years of education: Not on file  . Highest education level: Not on file  Occupational History  . Not on file  Social Needs  . Financial resource strain: Not on file  . Food insecurity    Worry: Not on file    Inability: Not on file  . Transportation needs    Medical: Not on file    Non-medical: Not on file  Tobacco Use  . Smoking status: Former Smoker    Packs/day: 0.25    Years: 20.00    Pack years: 5.00    Types: Cigarettes    Start date: 09/02/1968    Quit date: 09/25/1987    Years since quitting: 31.5  . Smokeless tobacco: Never Used  Substance and Sexual Activity  . Alcohol use: No  . Drug use:  No  . Sexual activity: Not on file  Lifestyle  . Physical activity    Days per week: Not on file    Minutes per session: Not on file  . Stress: Not on file  Relationships  . Social Herbalist on phone: Not on file    Gets together: Not on file    Attends religious service: Not on file    Active member of club or organization: Not on file    Attends meetings of clubs or organizations: Not on file    Relationship status: Not on file  . Intimate partner violence    Fear of current or ex partner: Not on file    Emotionally abused: Not on file    Physically abused: Not on file    Forced sexual activity: Not on file  Other Topics Concern  . Not on file   Social History Narrative   Originally from [place]. Came to the [place] prior to 1987.   Lives with his wife.   Adult children from a previous marriage live independently in [place].   Current Meds  Medication Sig  . atorvastatin (LIPITOR) 40 MG tablet Take 40 mg by mouth daily.    Marland Kitchen escitalopram (LEXAPRO) 10 MG tablet Take by mouth.  Marland Kitchen lisinopril (PRINIVIL,ZESTRIL) 5 MG tablet TAKE ONE TABLET (5 MG TOTAL) BY MOUTH DAILY.  Marland Kitchen oxyCODONE (ROXICODONE) 15 MG immediate release tablet Take by mouth.  . valACYclovir (VALTREX) 500 MG tablet Take 1 tablet (500 mg total) by mouth 2 (two) times daily.  . [DISCONTINUED] oxyCODONE-acetaminophen (PERCOCET) 10-325 MG per tablet Take 1 tablet by mouth every 6 (six) hours as needed for pain.   History reviewed. No pertinent family history. No Known Allergies   ROS: Per HPI  Objective: Office vital signs reviewed. BP 110/70   Pulse 71   Temp 98.2 F (36.8 C) (Oral)   Ht 5\' 7"  (1.702 m)   Wt 211 lb (95.7 kg)   BMI 33.05 kg/m   Physical Examination:  General: Awake, alert, well nourished, No acute distress HEENT: Normal, sclera white GU: palpable large inguinal hernia noted on right. Left with smaller hernia palpable.  Both are reducible and nontender to touch. MSK: normal gait and station Skin: dry; intact; no rashes or lesions Psych: mood stable, speech normal.  Depression screen [facility] 2/9 04/03/2019  Decreased Interest 0  Down, Depressed, Hopeless 0  PHQ - 2 Score 0  Altered sleeping 0  Tired, decreased energy 0  Change in appetite 0  Feeling bad or failure about yourself  0  Trouble concentrating 0  Moving slowly or fidgety/restless 0  Suicidal thoughts 0  PHQ-9 Score 0    Assessment/ Plan: 63 y.o. male   1. Non-recurrent bilateral inguinal hernia without obstruction or gangrene Bilateral inguinal hernias noted.  No red flag signs or symptoms.  I placed referral to general surgery in [facility] per his request.  We discussed red flag  signs and symptoms and a handout was provided. - Ambulatory referral to General Surgery  2. Establishing care with new doctor, encounter for I reviewed his records in care everywhere.  Unfortunately, I could not find previous colonoscopy results though he does note he has had this in the past in [place].  3. Positive fecal immunochemical test I placed a referral to gastroenterology.  He has had colonoscopy in [place] but he is not sure where.  Given recent positive FOBT I have sent him home with a new FOBT  card for collection but reiterated that he would likely benefit from colonoscopy. - Fecal occult blood, imunochemical; Future - Ambulatory referral to Gastroenterology  4. Gastroesophageal reflux disease without esophagitis - omeprazole (PRILOSEC) 20 MG capsule; Take 1 capsule (20 mg total) by mouth daily.  Dispense: 30 capsule; Refill: 3   Tellis Spivak Hulen SkainsM Tarini Carrier, DO [facility] [phone]

## 2019-04-16 ENCOUNTER — Ambulatory Visit: Payer: Self-pay | Admitting: Surgery

## 2019-04-16 DIAGNOSIS — K4020 Bilateral inguinal hernia, without obstruction or gangrene, not specified as recurrent: Secondary | ICD-10-CM | POA: Diagnosis not present

## 2019-04-16 NOTE — H&P (Signed)
Surgical H&P CC: inguinal hernias  HPI: 63 year old man who presents with bilateral inguinal hernias which were identified on a recent physical exam. He states that he has noted swelling in the right more so than the left groin for quite some time and did not realize it was a hernia until he saw his primary care physician. He denies any GI symptoms. Denies any pain, although he is on chronic pain management secondary to pelvic pain related to extensive pelvic fracture sustained in a work accident in 2012. Denies any urinary symptoms. Has not had any prior abdominal surgery although did require surgical fixation of his pelvic fractures. He is interested in having hernias repaired.  He denies tobacco use. [place] in [employer].  No Known Allergies  Past Medical History:  Diagnosis Date  . Hypertension   . Tobacco abuse     Past Surgical History:  Procedure Laterality Date  . EXTERNAL FIXATION PELVIS  09/10/2011   Procedure: EXTERNAL FIXATION PELVIS;  Surgeon: Metta Clines Supple;  Location: [facility];  Service: Orthopedics;  Laterality: N/A;  . IRRIGATION AND DEBRIDEMENT KNEE  09/10/2011   Procedure: IRRIGATION AND DEBRIDEMENT KNEE;  Surgeon: Metta Clines Supple;  Location: [facility];  Service: Orthopedics;  Laterality: Left;  . ORIF ANKLE FRACTURE  09/14/2011   Procedure: OPEN REDUCTION INTERNAL FIXATION (ORIF) ANKLE FRACTURE;  Surgeon: Rozanna Box;  Location: [facility];  Service: Orthopedics;  Laterality: Right;  . ORIF PELVIC FRACTURE  09/14/2011   Procedure: OPEN REDUCTION INTERNAL FIXATION (ORIF) PELVIC FRACTURE;  Surgeon: Rozanna Box;  Location: [facility];  Service: Orthopedics;  Laterality: N/A;  . SHOULDER ARTHROSCOPY  03/25/2012   Procedure: ARTHROSCOPY SHOULDER;  Surgeon: Nita Sells, MD;  Location: [facility];  Service: Orthopedics;  Laterality: Right;  Right Shoulder Arthroscopy with Capsular Release and Manipulation with Debridement of Irreparable  Rotator Cuff Tear    No family history on file.  Social History   Socioeconomic History  . Marital status: Married    Spouse name: Not on file  . Number of children: Not on file  . Years of education: Not on file  . Highest education level: Not on file  Occupational History  . Not on file  Social Needs  . Financial resource strain: Not on file  . Food insecurity    Worry: Not on file    Inability: Not on file  . Transportation needs    Medical: Not on file    Non-medical: Not on file  Tobacco Use  . Smoking status: Former Smoker    Packs/day: 0.25    Years: 20.00    Pack years: 5.00    Types: Cigarettes    Start date: 09/02/1968    Quit date: 09/25/1987    Years since quitting: 31.5  . Smokeless tobacco: Never Used  Substance and Sexual Activity  . Alcohol use: No  . Drug use: No  . Sexual activity: Not on file  Lifestyle  . Physical activity    Days per week: Not on file    Minutes per session: Not on file  . Stress: Not on file  Relationships  . Social Herbalist on phone: Not on file    Gets together: Not on file    Attends religious service: Not on file    Active member of club or organization: Not on file    Attends meetings of clubs or organizations: Not on file    Relationship  status: Not on file  Other Topics Concern  . Not on file  Social History Narrative   Originally from [place]. Came to the [place] prior to 1987.   Lives with his wife.   Adult children from a previous marriage live independently in [place].    Current Outpatient Medications on File Prior to Visit  Medication Sig Dispense Refill  . atorvastatin (LIPITOR) 40 MG tablet Take 40 mg by mouth daily.      Marland Kitchen. escitalopram (LEXAPRO) 10 MG tablet Take by mouth.    . gabapentin (NEURONTIN) 300 MG capsule 3 (three) times daily. Take on    . ipratropium (ATROVENT) 0.06 % nasal spray Place 2 sprays into both nostrils 4 (four) times daily. 15 mL 1  . lisinopril (PRINIVIL,ZESTRIL) 5 MG  tablet TAKE ONE TABLET (5 MG TOTAL) BY MOUTH DAILY.    Marland Kitchen. omeprazole (PRILOSEC) 20 MG capsule Take 1 capsule (20 mg total) by mouth daily. 30 capsule 3  . oxyCODONE (ROXICODONE) 15 MG immediate release tablet Take by mouth.    . valACYclovir (VALTREX) 500 MG tablet Take 1 tablet (500 mg total) by mouth 2 (two) times daily. 4 tablet 3   No current facility-administered medications on file prior to visit.     Review of Systems: a complete, 10pt review of systems was completed with pertinent positives and negatives as documented in the HPI  Physical Exam: 04/16/2019 9:53 AM Weight: 214.38 lb Height: 67in Body Surface Area: 2.08 m Body Mass Index: 33.58 kg/m  Temp.: 98.65F  Pulse: 54 (Regular)  P.OX: 95% (Room air) BP: 128/72 (Sitting, Left Arm, Standard)  Gen: alert and well appearing Eye: extraocular motion intact, no scleral icterus ENT: moist mucus membranes, dentition intact Neck: no mass or thyromegaly Chest: unlabored respirations, symmetrical air entry, clear bilaterally CV: regular rate and rhythm, no pedal edema Abdomen: soft, nontender, nondistended. No mass or organomegaly. Large but reducible right inguinal hernia, likely direct. Moderate reducible direct left inguinal hernia. MSK: strength symmetrical throughout, no deformity Neuro: grossly intact, normal gait Psych: normal mood and affect, appropriate insight Skin: warm and dry, no rash or lesion on limited exam   CBC Latest Ref Rng & Units 03/25/2012 09/26/2011 09/20/2011  WBC 4.0 - 10.5 K/uL - 6.9 8.2  Hemoglobin 13.0 - 17.0 g/dL 16.113.9 11.2(L) 10.2(L)  Hematocrit 39.0 - 52.0 % 41.0 34.8(L) 30.8(L)  Platelets 150 - 400 K/uL - 636(H) 354    CMP Latest Ref Rng & Units 03/25/2012 09/26/2011 09/20/2011  Glucose 70 - 99 mg/dL 096(E108(H) 97 454(U121(H)  BUN 6 - 23 mg/dL 14 14 16   Creatinine 0.50 - 1.35 mg/dL 9.810.70 1.910.62 4.780.56  Sodium 135 - 145 mEq/L 140 131(L) 134(L)  Potassium 3.5 - 5.1 mEq/L 3.5 4.2 3.6  Chloride 96 - 112  mEq/L 104 96 97  CO2 19 - 32 mEq/L - 27 29  Calcium 8.4 - 10.5 mg/dL - 8.6 2.9(F8.3(L)  Total Protein 6.0 - 8.3 g/dL - 6.6 -  Total Bilirubin 0.3 - 1.2 mg/dL - 1.0 -  Alkaline Phos 39 - 117 U/L - 95 -  AST 0 - 37 U/L - 22 -  ALT 0 - 53 U/L - 33 -    Lab Results  Component Value Date   INR 3.10 (H) 09/27/2011   INR 3.75 (H) 09/26/2011   INR 3.86 (H) 09/25/2011    Imaging: No results found.    A/P: BILATERAL INGUINAL HERNIA (K40.20) Story: Minimally symptomatic, reducible. Patient desires repair. I recommend proceeding  with an open approach given the size of the hernias. Discussed risks of bleeding, infection, pain, scarring, injury to structures in the area including blood vessels of the testicle, bladder, nerves, bowel, etc., risk of chronic pain, risk of hernia recurrence. Discussed risks of general anesthesia including heart attack, pulmonary and vascular/blood clots. Discussed need to avoid any heavy lifting or shininess activity for 6 weeks. Questions were answered. He wishes to proceed with surgery.   Phylliss Blakeshelsea Jaspreet Hollings, MD [facility], [place] Pager [phone]

## 2019-04-20 ENCOUNTER — Other Ambulatory Visit: Payer: Self-pay | Admitting: Family Medicine

## 2019-04-24 ENCOUNTER — Telehealth: Payer: Self-pay | Admitting: *Deleted

## 2019-04-24 ENCOUNTER — Other Ambulatory Visit: Payer: Self-pay | Admitting: Physician Assistant

## 2019-04-24 MED ORDER — ESCITALOPRAM OXALATE 10 MG PO TABS: 15.0000 mg | ORAL_TABLET | Freq: Every day | ORAL | 1 refills | Status: DC

## 2019-04-24 NOTE — Telephone Encounter (Signed)
sent 

## 2019-04-24 NOTE — Telephone Encounter (Signed)
Dr. Edrick Oh had increased patient's Lexapro to 1.5 tabs QD This is in Theodore Gonzalez's OV note on 05/16/18, New Rx was not sent in Rx from [place] was refilled as 1 QD Please send in new Rx

## 2019-04-28 DIAGNOSIS — G894 Chronic pain syndrome: Secondary | ICD-10-CM | POA: Diagnosis not present

## 2019-04-28 DIAGNOSIS — M25572 Pain in left ankle and joints of left foot: Secondary | ICD-10-CM | POA: Diagnosis not present

## 2019-04-28 DIAGNOSIS — Z9889 Other specified postprocedural states: Secondary | ICD-10-CM | POA: Diagnosis not present

## 2019-04-28 DIAGNOSIS — G8929 Other chronic pain: Secondary | ICD-10-CM | POA: Diagnosis not present

## 2019-05-07 ENCOUNTER — Encounter: Payer: Self-pay | Admitting: Family Medicine

## 2019-05-18 ENCOUNTER — Other Ambulatory Visit: Payer: Self-pay | Admitting: Family Medicine

## 2019-05-18 MED ORDER — LISINOPRIL 5 MG PO TABS: ORAL_TABLET | ORAL | 1 refills | Status: DC

## 2019-05-19 ENCOUNTER — Other Ambulatory Visit: Payer: Self-pay | Admitting: Family Medicine

## 2019-05-19 NOTE — Telephone Encounter (Signed)
Lisinopril refill sent on 05/18/2019

## 2019-06-08 NOTE — Patient Instructions (Signed)
DUE TO COVID-19 ONLY ONE VISITOR IS ALLOWED TO COME WITH YOU AND STAY IN THE WAITING ROOM ONLY DURING PRE OP AND PROCEDURE DAY OF SURGERY.  THE 1 VISITOR MAY VISIT WITH YOU AFTER SURGERY IN YOUR PRIVATE ROOM DURING VISITING HOURS ONLY!  YOU NEED TO HAVE A COVID 19 TEST ON__Tuesday 9/15_____ @_______ , THIS TEST MUST BE DONE BEFORE SURGERY, COME  [place], [address] [place] , [address].  ([place])  ONCE YOUR COVID TEST IS COMPLETED, PLEASE BEGIN THE QUARANTINE INSTRUCTIONS AS OUTLINED IN YOUR HANDOUT.                [name]    Your procedure is scheduled on:    Report to [facility] Main  Entrance   Report to Short Stay at 5:30 AM     Call this number if you have problems the morning of surgery [phone]     Remember: Do not eat food or drink liquids :After Midnight  . BRUSH YOUR TEETH MORNING OF SURGERY AND RINSE YOUR MOUTH OUT, NO CHEWING GUM CANDY OR MINTS.     Take these medicines the morning of surgery with A SIP OF WATER: Gabapentin, Lexapro, Valtrex, Prilosec                                 You may not have any metal on your body including piercings              Do not wear jewelry,  lotions, powders or  deodorant              Men may shave face and neck.   Do not bring valuables to the hospital. [place].  Contacts, dentures or bridgework may not be worn into surgery.      Patients discharged the day of surgery will not be allowed to drive home.   IF YOU ARE HAVING SURGERY AND GOING HOME THE SAME DAY, YOU MUST HAVE AN ADULT TO DRIVE YOU HOME AND BE WITH YOU FOR 24 HOURS.   YOU MAY GO HOME BY TAXI OR UBER OR ORTHERWISE, BUT AN ADULT MUST ACCOMPANY YOU HOME AND STAY WITH YOU FOR 24 HOURS.  Name and phone number of your driver:  Special Instructions: N/A              Please read over the following fact sheets you were  given: _____________________________________________________________________             [facility] - Preparing for Surgery Before surgery, you can play an important role.   Because skin is not sterile, your skin needs to be as free of germs as possible.   You can reduce the number of germs on your skin by washing with CHG (chlorahexidine gluconate) soap before surgery.   CHG is an antiseptic cleaner which kills germs and bonds with the skin to continue killing germs even after washing. Please DO NOT use if you have an allergy to CHG or antibacterial soaps.   If your skin becomes reddened/irritated stop using the CHG and inform your nurse when you arrive at Short Stay.   You may shave your face/neck. Please follow these instructions carefully:  1.  Shower with CHG Soap the night before surgery and the  morning of Surgery.  2.  If you choose to wash your hair, wash your hair first as usual with your  normal  shampoo.  3.  After you shampoo, rinse your hair and body thoroughly to remove the  shampoo.                                        4.  Use CHG as you would any other liquid soap.  You can apply chg directly  to the skin and wash                       Gently with a scrungie or clean washcloth.  5.  Apply the CHG Soap to your body ONLY FROM THE NECK DOWN.   Do not use on face/ open                           Wound or open sores. Avoid contact with eyes, ears mouth and genitals (private parts).                       Wash face,  Genitals (private parts) with your normal soap.             6.  Wash thoroughly, paying special attention to the area where your surgery  will be performed.  7.  Thoroughly rinse your body with warm water from the neck down.  8.  DO NOT shower/wash with your normal soap after using and rinsing off  the CHG Soap.             9.  Pat yourself dry with a clean towel.            10.  Wear clean pajamas.            11.  Place clean sheets on your bed the night of your  first shower and do not  sleep with pets.  Day of Surgery : Do not apply any lotions/deodorants the morning of surgery.  Please wear clean clothes to the hospital/surgery center.   FAILURE TO FOLLOW THESE INSTRUCTIONS MAY RESULT IN THE CANCELLATION OF YOUR SURGERY PATIENT SIGNATURE_________________________________  NURSE SIGNATURE__________________________________  ________________________________________________________________________

## 2019-06-09 ENCOUNTER — Encounter (HOSPITAL_COMMUNITY)
Admission: RE | Admit: 2019-06-09 | Discharge: 2019-06-09 | Disposition: A | Discharge status: Home / Self Care | Payer: BC Managed Care – PPO | Source: Ambulatory Visit | Attending: Surgery | Admitting: Surgery

## 2019-06-09 ENCOUNTER — Other Ambulatory Visit (HOSPITAL_COMMUNITY)
Admission: RE | Admit: 2019-06-09 | Discharge: 2019-06-09 | Disposition: A | Discharge status: Home / Self Care | Payer: BC Managed Care – PPO | Source: Ambulatory Visit | Attending: Surgery | Admitting: Surgery

## 2019-06-09 ENCOUNTER — Other Ambulatory Visit (HOSPITAL_COMMUNITY): Payer: BLUE CROSS/BLUE SHIELD

## 2019-06-09 DIAGNOSIS — Z01812 Encounter for preprocedural laboratory examination: Secondary | ICD-10-CM | POA: Insufficient documentation

## 2019-06-09 DIAGNOSIS — Z20828 Contact with and (suspected) exposure to other viral communicable diseases: Secondary | ICD-10-CM | POA: Insufficient documentation

## 2019-06-10 ENCOUNTER — Other Ambulatory Visit: Payer: Self-pay

## 2019-06-10 ENCOUNTER — Encounter (HOSPITAL_COMMUNITY): Payer: Self-pay

## 2019-06-10 ENCOUNTER — Encounter (HOSPITAL_COMMUNITY)
Admission: RE | Admit: 2019-06-10 | Discharge: 2019-06-10 | Disposition: A | Discharge status: Home / Self Care | Payer: BC Managed Care – PPO | Source: Ambulatory Visit | Attending: Surgery | Admitting: Surgery

## 2019-06-10 DIAGNOSIS — Z01818 Encounter for other preprocedural examination: Secondary | ICD-10-CM | POA: Diagnosis not present

## 2019-06-10 DIAGNOSIS — I10 Essential (primary) hypertension: Secondary | ICD-10-CM | POA: Insufficient documentation

## 2019-06-10 DIAGNOSIS — I4510 Unspecified right bundle-branch block: Secondary | ICD-10-CM | POA: Insufficient documentation

## 2019-06-10 DIAGNOSIS — K4020 Bilateral inguinal hernia, without obstruction or gangrene, not specified as recurrent: Secondary | ICD-10-CM | POA: Insufficient documentation

## 2019-06-10 LAB — CBC WITH DIFFERENTIAL/PLATELET
Abs Immature Granulocytes: 0.02 10*3/uL (ref 0.00–0.07)
Basophils Absolute: 0.1 10*3/uL (ref 0.0–0.1)
Basophils Relative: 1 %
Eosinophils Absolute: 0.2 10*3/uL (ref 0.0–0.5)
Eosinophils Relative: 3 %
HCT: 44.9 % (ref 39.0–52.0)
Hemoglobin: 15.1 g/dL (ref 13.0–17.0)
Immature Granulocytes: 0 %
Lymphocytes Relative: 40 %
Lymphs Abs: 3.2 10*3/uL (ref 0.7–4.0)
MCH: 31.3 pg (ref 26.0–34.0)
MCHC: 33.6 g/dL (ref 30.0–36.0)
MCV: 93.2 fL (ref 80.0–100.0)
Monocytes Absolute: 0.7 10*3/uL (ref 0.1–1.0)
Monocytes Relative: 9 %
Neutro Abs: 3.9 10*3/uL (ref 1.7–7.7)
Neutrophils Relative %: 47 %
Platelets: 311 10*3/uL (ref 150–400)
RBC: 4.82 MIL/uL (ref 4.22–5.81)
RDW: 13.7 % (ref 11.5–15.5)
WBC: 8.1 10*3/uL (ref 4.0–10.5)
nRBC: 0 % (ref 0.0–0.2)

## 2019-06-10 LAB — BASIC METABOLIC PANEL
Anion gap: 9 (ref 5–15)
BUN: 17 mg/dL (ref 8–23)
CO2: 25 mmol/L (ref 22–32)
Calcium: 9 mg/dL (ref 8.9–10.3)
Chloride: 102 mmol/L (ref 98–111)
Creatinine, Ser: 0.82 mg/dL (ref 0.61–1.24)
GFR calc Af Amer: >60 mL/min (ref >60)
GFR calc non Af Amer: >60 mL/min (ref >60)
Glucose, Bld: 140 mg/dL — ABNORMAL HIGH (ref 70–99)
Potassium: 4.2 mmol/L (ref 3.5–5.1)
Sodium: 136 mmol/L (ref 135–145)

## 2019-06-10 LAB — NOVEL CORONAVIRUS, NAA (HOSP ORDER, SEND-OUT TO REF LAB; TAT 18-24 HRS): SARS-CoV-2, NAA: NOT DETECTED

## 2019-06-10 NOTE — Patient Instructions (Addendum)
DUE TO COVID-19 ONLY ONE VISITOR IS ALLOWED TO COME WITH YOU AND STAY IN THE WAITING ROOM ONLY DURING PRE OP AND PROCEDURE DAY OF SURGERY. THE 1 VISITOR MAY VISIT WITH YOU AFTER SURGERY IN YOUR PRIVATE ROOM DURING VISITING HOURS ONLY!  YOU HAVE HAD A COVID 19 TEST ON 06-09-19.  PLEASE CONTINUE THE QUARANTINE INSTRUCTIONS AS OUTLINED IN YOUR HANDOUT.                NIEKO CLARIN  06/10/2019   Your procedure is scheduled on: 06-12-19    Report to [facility] Main  Entrance     Report to Short Stay at 5:30 AM     Call this number if you have problems the morning of surgery [phone]    Remember: Do not eat food or drink liquids :After Midnight.     Take these medicines the morning of surgery with A SIP OF WATER: Gabapentin, Lexapro, Valtrex, and Prilosec                                You may not have any metal on your body including hair pins and              piercings    Do not wear jewelry, cologne, lotions, powders or  deodorant                       Men may shave face and neck.   Do not bring valuables to the hospital. [place].  Contacts, dentures or bridgework may not be worn into surgery.      Patients discharged the day of surgery will not be allowed to drive home. IF YOU ARE HAVING SURGERY AND GOING HOME THE SAME DAY, YOU MUST HAVE AN ADULT TO DRIVE YOU HOME AND BE WITH YOU FOR 24 HOURS. YOU MAY GO HOME BY TAXI OR UBER OR ORTHERWISE, BUT AN ADULT MUST ACCOMPANY YOU HOME AND STAY WITH YOU FOR 24 HOURS.    Name and phone number of your driver: Tylor Courtwright [phone]                Please read over the following fact sheets you were given: _____________________________________________________________________             [facility] - Preparing for Surgery Before surgery, you can play an important role.  Because skin is not sterile, your skin needs to be as free of germs as possible.  You can reduce the  number of germs on your skin by washing with CHG (chlorahexidine gluconate) soap before surgery.  CHG is an antiseptic cleaner which kills germs and bonds with the skin to continue killing germs even after washing. Please DO NOT use if you have an allergy to CHG or antibacterial soaps.  If your skin becomes reddened/irritated stop using the CHG and inform your nurse when you arrive at Short Stay. Do not shave (including legs and underarms) for at least 48 hours prior to the first CHG shower.  You may shave your face/neck. Please follow these instructions carefully:  1.  Shower with CHG Soap the night before surgery and the  morning of Surgery.  2.  If you choose to wash your hair, wash your hair first as usual with your  normal  shampoo.  3.  After you shampoo, rinse your hair and body thoroughly to remove the  shampoo.                           4.  Use CHG as you would any other liquid soap.  You can apply chg directly  to the skin and wash                       Gently with a scrungie or clean washcloth.  5.  Apply the CHG Soap to your body ONLY FROM THE NECK DOWN.   Do not use on face/ open                           Wound or open sores. Avoid contact with eyes, ears mouth and genitals (private parts).                       Wash face,  Genitals (private parts) with your normal soap.             6.  Wash thoroughly, paying special attention to the area where your surgery  will be performed.  7.  Thoroughly rinse your body with warm water from the neck down.  8.  DO NOT shower/wash with your normal soap after using and rinsing off  the CHG Soap.                9.  Pat yourself dry with a clean towel.            10.  Wear clean pajamas.            11.  Place clean sheets on your bed the night of your first shower and do not  sleep with pets. Day of Surgery : Do not apply any lotions/deodorants the morning of surgery.  Please wear clean clothes to the hospital/surgery center.  FAILURE TO FOLLOW  THESE INSTRUCTIONS MAY RESULT IN THE CANCELLATION OF YOUR SURGERY PATIENT SIGNATURE_________________________________  NURSE SIGNATURE__________________________________  ________________________________________________________________________

## 2019-06-10 NOTE — Progress Notes (Signed)
PCP - Dr. Lajuana Ripple Cardiologist -   Chest x-ray -  EKG -  Stress Test -  ECHO -  Cardiac Cath -   Sleep Study -  CPAP -   Fasting Blood Sugar -  Checks Blood Sugar _____ times a day  Blood Thinner Instructions: Aspirin Instructions: Last Dose:  Anesthesia review:   Patient denies shortness of breath, fever, cough and chest pain at PAT appointment   Patient verbalized understanding of instructions that were given to them at the PAT appointment. Patient was also instructed that they will need to review over the PAT instructions again at home before surgery.

## 2019-06-11 ENCOUNTER — Encounter (HOSPITAL_COMMUNITY): Payer: Self-pay | Admitting: Anesthesiology

## 2019-06-11 MED ORDER — BUPIVACAINE LIPOSOME 1.3 % IJ SUSP
20.0000 mL | Freq: Once | INTRAMUSCULAR | Status: DC
  Filled 2019-06-11: qty 20

## 2019-06-11 NOTE — Anesthesia Preprocedure Evaluation (Addendum)
Anesthesia Evaluation  Patient identified by MRN, date of birth, ID band Patient awake    Reviewed: Allergy & Precautions, NPO status , Patient's Chart, lab work & pertinent test results  Airway Mallampati: III  TM Distance: >3 FB Neck ROM: Full    Dental  (+) Teeth Intact, Dental Advisory Given   Pulmonary sleep apnea , former smoker,    breath sounds clear to auscultation       Cardiovascular hypertension, Pt. on medications  Rhythm:Regular Rate:Normal     Neuro/Psych negative neurological ROS  negative psych ROS   GI/Hepatic Neg liver ROS, GERD  Medicated,  Endo/Other  negative endocrine ROS  Renal/GU negative Renal ROS     Musculoskeletal negative musculoskeletal ROS (+)   Abdominal (+) + obese,   Peds  Hematology negative hematology ROS (+)   Anesthesia Other Findings   Reproductive/Obstetrics                            Anesthesia Physical Anesthesia Plan  ASA: II  Anesthesia Plan: General   Post-op Pain Management: GA combined w/ Regional for post-op pain   Induction: Intravenous  PONV Risk Score and Plan: 3 and Ondansetron, Dexamethasone and Midazolam  Airway Management Planned: Oral ETT  Additional Equipment: None  Intra-op Plan:   Post-operative Plan: Extubation in OR  Informed Consent: I have reviewed the patients History and Physical, chart, labs and discussed the procedure including the risks, benefits and alternatives for the proposed anesthesia with the patient or authorized representative who has indicated his/her understanding and acceptance.     Dental advisory given  Plan Discussed with: CRNA  Anesthesia Plan Comments:        Anesthesia Quick Evaluation

## 2019-06-12 ENCOUNTER — Encounter (HOSPITAL_COMMUNITY)
Admission: RE | Disposition: A | Discharge status: Home / Self Care | Payer: Self-pay | Source: Other Acute Inpatient Hospital | Attending: Surgery

## 2019-06-12 ENCOUNTER — Ambulatory Visit (HOSPITAL_COMMUNITY)
Admission: RE | Admit: 2019-06-12 | Discharge: 2019-06-12 | Disposition: A | Discharge status: Home / Self Care | Payer: BC Managed Care – PPO | Source: Other Acute Inpatient Hospital | Attending: Surgery | Admitting: Surgery

## 2019-06-12 ENCOUNTER — Ambulatory Visit (HOSPITAL_COMMUNITY): Payer: BC Managed Care – PPO | Admitting: Anesthesiology

## 2019-06-12 ENCOUNTER — Ambulatory Visit (HOSPITAL_COMMUNITY): Payer: BC Managed Care – PPO | Admitting: Physician Assistant

## 2019-06-12 DIAGNOSIS — K4090 Unilateral inguinal hernia, without obstruction or gangrene, not specified as recurrent: Secondary | ICD-10-CM | POA: Diagnosis not present

## 2019-06-12 DIAGNOSIS — K4020 Bilateral inguinal hernia, without obstruction or gangrene, not specified as recurrent: Secondary | ICD-10-CM | POA: Diagnosis not present

## 2019-06-12 DIAGNOSIS — Z79891 Long term (current) use of opiate analgesic: Secondary | ICD-10-CM | POA: Insufficient documentation

## 2019-06-12 DIAGNOSIS — G4730 Sleep apnea, unspecified: Secondary | ICD-10-CM | POA: Insufficient documentation

## 2019-06-12 DIAGNOSIS — Z79899 Other long term (current) drug therapy: Secondary | ICD-10-CM | POA: Insufficient documentation

## 2019-06-12 DIAGNOSIS — Z87891 Personal history of nicotine dependence: Secondary | ICD-10-CM | POA: Diagnosis not present

## 2019-06-12 DIAGNOSIS — G8918 Other acute postprocedural pain: Secondary | ICD-10-CM | POA: Diagnosis not present

## 2019-06-12 DIAGNOSIS — K219 Gastro-esophageal reflux disease without esophagitis: Secondary | ICD-10-CM | POA: Insufficient documentation

## 2019-06-12 DIAGNOSIS — I10 Essential (primary) hypertension: Secondary | ICD-10-CM | POA: Insufficient documentation

## 2019-06-12 DIAGNOSIS — K439 Ventral hernia without obstruction or gangrene: Secondary | ICD-10-CM | POA: Diagnosis not present

## 2019-06-12 DIAGNOSIS — E785 Hyperlipidemia, unspecified: Secondary | ICD-10-CM | POA: Diagnosis not present

## 2019-06-12 HISTORY — PX: INGUINAL HERNIA REPAIR: SHX194

## 2019-06-12 SURGERY — REPAIR, HERNIA, INGUINAL, ADULT
Anesthesia: General | Site: Inguinal | Laterality: Bilateral

## 2019-06-12 MED ORDER — KETAMINE HCL 10 MG/ML IJ SOLN
INTRAMUSCULAR | Status: AC
  Filled 2019-06-12: qty 1

## 2019-06-12 MED ORDER — DEXAMETHASONE SODIUM PHOSPHATE 10 MG/ML IJ SOLN
INTRAMUSCULAR | Status: AC
  Filled 2019-06-12: qty 1

## 2019-06-12 MED ORDER — SODIUM CHLORIDE 0.9 % IV SOLN
INTRAVENOUS | Status: DC | PRN
  Administered 2019-06-12: 15 ug/min via INTRAVENOUS

## 2019-06-12 MED ORDER — PROPOFOL 10 MG/ML IV BOLUS
INTRAVENOUS | Status: AC
  Filled 2019-06-12: qty 40

## 2019-06-12 MED ORDER — ACETAMINOPHEN 500 MG PO TABS
1000.0000 mg | ORAL_TABLET | ORAL | Status: AC
  Administered 2019-06-12: 06:00:00 1000 mg via ORAL
  Filled 2019-06-12: qty 2

## 2019-06-12 MED ORDER — PROPOFOL 10 MG/ML IV BOLUS
INTRAVENOUS | Status: DC | PRN
  Administered 2019-06-12: 50 mg via INTRAVENOUS
  Administered 2019-06-12: 150 mg via INTRAVENOUS

## 2019-06-12 MED ORDER — IBUPROFEN 200 MG PO TABS: 200.0000 mg | ORAL_TABLET | Freq: Four times a day (QID) | ORAL | 0 refills | Status: AC | PRN

## 2019-06-12 MED ORDER — BUPIVACAINE LIPOSOME 1.3 % IJ SUSP
INTRAMUSCULAR | Status: DC | PRN
  Administered 2019-06-12: 20 mL

## 2019-06-12 MED ORDER — ACETAMINOPHEN 160 MG/5ML PO SOLN: 325.0000 mg | Freq: Once | ORAL | Status: DC | PRN

## 2019-06-12 MED ORDER — FENTANYL CITRATE (PF) 100 MCG/2ML IJ SOLN
INTRAMUSCULAR | Status: DC | PRN
  Administered 2019-06-12 (×2): 50 ug via INTRAVENOUS
  Administered 2019-06-12: 25 ug via INTRAVENOUS

## 2019-06-12 MED ORDER — PHENYLEPHRINE 40 MCG/ML (10ML) SYRINGE FOR IV PUSH (FOR BLOOD PRESSURE SUPPORT)
PREFILLED_SYRINGE | INTRAVENOUS | Status: AC
  Filled 2019-06-12: qty 10

## 2019-06-12 MED ORDER — SODIUM CHLORIDE 0.9 % IV SOLN
INTRAVENOUS | Status: DC | PRN
  Administered 2019-06-12: 2 ug/kg/min via INTRAVENOUS

## 2019-06-12 MED ORDER — KETAMINE HCL 10 MG/ML IJ SOLN
INTRAMUSCULAR | Status: DC | PRN
  Administered 2019-06-12 (×4): 10 mg via INTRAVENOUS

## 2019-06-12 MED ORDER — ACETAMINOPHEN 325 MG PO TABS: 325.0000 mg | ORAL_TABLET | Freq: Once | ORAL | Status: DC | PRN

## 2019-06-12 MED ORDER — CEFAZOLIN SODIUM-DEXTROSE 2-4 GM/100ML-% IV SOLN
2.0000 g | INTRAVENOUS | Status: AC
  Administered 2019-06-12: 08:00:00 2 g via INTRAVENOUS
  Filled 2019-06-12: qty 100

## 2019-06-12 MED ORDER — MIDAZOLAM HCL 2 MG/2ML IJ SOLN
INTRAMUSCULAR | Status: AC
  Filled 2019-06-12: qty 2

## 2019-06-12 MED ORDER — 0.9 % SODIUM CHLORIDE (POUR BTL) OPTIME
TOPICAL | Status: DC | PRN
  Administered 2019-06-12: 08:00:00 1000 mL

## 2019-06-12 MED ORDER — CELECOXIB 200 MG PO CAPS
200.0000 mg | ORAL_CAPSULE | ORAL | Status: AC
  Administered 2019-06-12: 06:00:00 200 mg via ORAL
  Filled 2019-06-12: qty 1

## 2019-06-12 MED ORDER — ESMOLOL HCL 100 MG/10ML IV SOLN
INTRAVENOUS | Status: DC | PRN
  Administered 2019-06-12: 20 mg via INTRAVENOUS
  Administered 2019-06-12: 40 mg via INTRAVENOUS
  Administered 2019-06-12 (×2): 20 mg via INTRAVENOUS

## 2019-06-12 MED ORDER — CHLORHEXIDINE GLUCONATE 4 % EX LIQD: 60.0000 mL | Freq: Once | CUTANEOUS | Status: DC

## 2019-06-12 MED ORDER — PHENYLEPHRINE HCL (PRESSORS) 10 MG/ML IV SOLN
INTRAVENOUS | Status: AC
  Filled 2019-06-12: qty 1

## 2019-06-12 MED ORDER — GLYCOPYRROLATE 0.2 MG/ML IJ SOLN
INTRAMUSCULAR | Status: DC | PRN
  Administered 2019-06-12: .1 mg via INTRAVENOUS

## 2019-06-12 MED ORDER — PROMETHAZINE HCL 25 MG/ML IJ SOLN: 6.2500 mg | INTRAMUSCULAR | Status: DC | PRN

## 2019-06-12 MED ORDER — LIDOCAINE 2% (20 MG/ML) 5 ML SYRINGE
INTRAMUSCULAR | Status: AC
  Filled 2019-06-12: qty 5

## 2019-06-12 MED ORDER — SUGAMMADEX SODIUM 200 MG/2ML IV SOLN
INTRAVENOUS | Status: DC | PRN
  Administered 2019-06-12 (×2): 200 mg via INTRAVENOUS

## 2019-06-12 MED ORDER — FENTANYL CITRATE (PF) 100 MCG/2ML IJ SOLN
INTRAMUSCULAR | Status: AC
  Filled 2019-06-12: qty 2

## 2019-06-12 MED ORDER — ACETAMINOPHEN 500 MG PO TABS: 1000.0000 mg | ORAL_TABLET | Freq: Four times a day (QID) | ORAL | 0 refills | Status: AC | PRN

## 2019-06-12 MED ORDER — LACTATED RINGERS IV SOLN
INTRAVENOUS | Status: DC
  Administered 2019-06-12 (×2): via INTRAVENOUS

## 2019-06-12 MED ORDER — BUPIVACAINE HCL (PF) 0.25 % IJ SOLN
INTRAMUSCULAR | Status: AC
  Filled 2019-06-12: qty 30

## 2019-06-12 MED ORDER — HYDROMORPHONE HCL 1 MG/ML IJ SOLN: 0.2500 mg | INTRAMUSCULAR | Status: DC | PRN

## 2019-06-12 MED ORDER — DIPHENHYDRAMINE HCL 50 MG/ML IJ SOLN
INTRAMUSCULAR | Status: DC | PRN
  Administered 2019-06-12: 12.5 mg via INTRAVENOUS

## 2019-06-12 MED ORDER — ROCURONIUM BROMIDE 100 MG/10ML IV SOLN
INTRAVENOUS | Status: DC | PRN
  Administered 2019-06-12: 5 mg via INTRAVENOUS
  Administered 2019-06-12: 60 mg via INTRAVENOUS
  Administered 2019-06-12: 10 mg via INTRAVENOUS

## 2019-06-12 MED ORDER — LIDOCAINE HCL (CARDIAC) PF 100 MG/5ML IV SOSY
PREFILLED_SYRINGE | INTRAVENOUS | Status: DC | PRN
  Administered 2019-06-12: 60 mg via INTRAVENOUS

## 2019-06-12 MED ORDER — MEPERIDINE HCL 50 MG/ML IJ SOLN: 6.2500 mg | INTRAMUSCULAR | Status: DC | PRN

## 2019-06-12 MED ORDER — ONDANSETRON HCL 4 MG/2ML IJ SOLN
INTRAMUSCULAR | Status: AC
  Filled 2019-06-12: qty 2

## 2019-06-12 MED ORDER — DEXAMETHASONE SODIUM PHOSPHATE 10 MG/ML IJ SOLN
INTRAMUSCULAR | Status: DC | PRN
  Administered 2019-06-12: 8 mg via INTRAVENOUS

## 2019-06-12 MED ORDER — GLYCOPYRROLATE PF 0.2 MG/ML IJ SOSY
PREFILLED_SYRINGE | INTRAMUSCULAR | Status: AC
  Filled 2019-06-12: qty 1

## 2019-06-12 MED ORDER — LACTATED RINGERS IV SOLN: INTRAVENOUS | Status: DC

## 2019-06-12 MED ORDER — PHENYLEPHRINE HCL (PRESSORS) 10 MG/ML IV SOLN
INTRAVENOUS | Status: DC | PRN
  Administered 2019-06-12 (×2): 40 ug via INTRAVENOUS
  Administered 2019-06-12: 80 ug via INTRAVENOUS

## 2019-06-12 MED ORDER — SUCCINYLCHOLINE 20MG/ML (10ML) SYRINGE FOR MEDFUSION PUMP - OPTIME
INTRAMUSCULAR | Status: DC | PRN
  Administered 2019-06-12: 100 mg via INTRAVENOUS

## 2019-06-12 MED ORDER — GABAPENTIN 300 MG PO CAPS
300.0000 mg | ORAL_CAPSULE | ORAL | Status: AC
  Administered 2019-06-12: 300 mg via ORAL
  Filled 2019-06-12: qty 1

## 2019-06-12 MED ORDER — ROCURONIUM BROMIDE 10 MG/ML (PF) SYRINGE
PREFILLED_SYRINGE | INTRAVENOUS | Status: AC
  Filled 2019-06-12: qty 10

## 2019-06-12 MED ORDER — ACETAMINOPHEN 10 MG/ML IV SOLN: 1000.0000 mg | Freq: Once | INTRAVENOUS | Status: DC | PRN

## 2019-06-12 MED ORDER — SUCCINYLCHOLINE CHLORIDE 200 MG/10ML IV SOSY
PREFILLED_SYRINGE | INTRAVENOUS | Status: AC
  Filled 2019-06-12: qty 10

## 2019-06-12 MED ORDER — DOCUSATE SODIUM 100 MG PO CAPS: 100.0000 mg | ORAL_CAPSULE | Freq: Two times a day (BID) | ORAL | 0 refills | Status: AC

## 2019-06-12 MED ORDER — MIDAZOLAM HCL 5 MG/5ML IJ SOLN
INTRAMUSCULAR | Status: DC | PRN
  Administered 2019-06-12 (×2): 1 mg via INTRAVENOUS

## 2019-06-12 SURGICAL SUPPLY — 42 items
APL PRP STRL LF DISP 70% ISPRP (MISCELLANEOUS) ×1
APL SKNCLS STERI-STRIP NONHPOA (GAUZE/BANDAGES/DRESSINGS) ×1
BENZOIN TINCTURE PRP APPL 2/3 (GAUZE/BANDAGES/DRESSINGS) ×3 IMPLANT
BLADE SURG 15 STRL LF DISP TIS (BLADE) ×1 IMPLANT
BLADE SURG 15 STRL SS (BLADE) ×3
CHLORAPREP W/TINT 26 (MISCELLANEOUS) ×3 IMPLANT
CLOSURE WOUND 1/2 X4 (GAUZE/BANDAGES/DRESSINGS) ×1
COVER SURGICAL LIGHT HANDLE (MISCELLANEOUS) ×3 IMPLANT
COVER WAND RF STERILE (DRAPES) IMPLANT
DRAIN PENROSE 18X1/2 LTX STRL (DRAIN) ×3 IMPLANT
DRAPE LAPAROSCOPIC ABDOMINAL (DRAPES) ×3 IMPLANT
ELECT PENCIL ROCKER SW 15FT (MISCELLANEOUS) ×3 IMPLANT
ELECT REM PT RETURN 15FT ADLT (MISCELLANEOUS) ×3 IMPLANT
GAUZE SPONGE 4X4 12PLY STRL (GAUZE/BANDAGES/DRESSINGS) ×2 IMPLANT
GLOVE BIO SURGEON STRL SZ 6 (GLOVE) ×3 IMPLANT
GLOVE BIOGEL PI IND STRL 7.0 (GLOVE) IMPLANT
GLOVE BIOGEL PI INDICATOR 7.0 (GLOVE) ×2
GLOVE ECLIPSE 7.0 STRL STRAW (GLOVE) ×2 IMPLANT
GLOVE INDICATOR 6.5 STRL GRN (GLOVE) ×3 IMPLANT
GLOVE SURG SS PI 7.0 STRL IVOR (GLOVE) ×4 IMPLANT
GOWN STRL REUS W/TWL LRG LVL3 (GOWN DISPOSABLE) ×3 IMPLANT
GOWN STRL REUS W/TWL XL LVL3 (GOWN DISPOSABLE) ×3 IMPLANT
KIT BASIN OR (CUSTOM PROCEDURE TRAY) ×3 IMPLANT
KIT TURNOVER KIT A (KITS) IMPLANT
NEEDLE HYPO 22GX1.5 SAFETY (NEEDLE) ×3 IMPLANT
PACK BASIC VI WITH GOWN DISP (CUSTOM PROCEDURE TRAY) ×3 IMPLANT
SPONGE LAP 4X18 RFD (DISPOSABLE) ×3 IMPLANT
STRIP CLOSURE SKIN 1/2X4 (GAUZE/BANDAGES/DRESSINGS) ×2 IMPLANT
SUT ETHIBOND 0 MO6 C/R (SUTURE) ×3 IMPLANT
SUT MNCRL AB 4-0 PS2 18 (SUTURE) ×3 IMPLANT
SUT NOVA NAB DX-16 0-1 5-0 T12 (SUTURE) ×4 IMPLANT
SUT SILK 3 0 (SUTURE) ×3
SUT SILK 3-0 18XBRD TIE 12 (SUTURE) ×1 IMPLANT
SUT VIC AB 0 UR5 27 (SUTURE) ×2 IMPLANT
SUT VIC AB 3-0 SH 27 (SUTURE) ×6
SUT VIC AB 3-0 SH 27XBRD (SUTURE) ×2 IMPLANT
SYR CONTROL 10ML LL (SYRINGE) ×3 IMPLANT
TAPE CLOTH SURG 4X10 WHT LF (GAUZE/BANDAGES/DRESSINGS) IMPLANT
TOWEL OR 17X26 10 PK STRL BLUE (TOWEL DISPOSABLE) ×3 IMPLANT
TOWEL OR NON WOVEN STRL DISP B (DISPOSABLE) ×3 IMPLANT
TRAY FOLEY MTR SLVR 16FR STAT (SET/KITS/TRAYS/PACK) ×2 IMPLANT
YANKAUER SUCT BULB TIP 10FT TU (MISCELLANEOUS) ×2 IMPLANT

## 2019-06-12 NOTE — Discharge Instructions (Signed)
HERNIA REPAIR: POST OP INSTRUCTIONS  ######################################################################  EAT Gradually transition to a high fiber diet with a fiber supplement over the next few weeks after discharge.  Start with a pureed / full liquid diet (see below)  WALK Walk an hour a day.  Control your pain to do that.    CONTROL PAIN Control pain so that you can walk, sleep, tolerate sneezing/coughing, and go up/down stairs.  HAVE A BOWEL MOVEMENT DAILY Keep your bowels regular to avoid problems.  OK to try a laxative to override constipation.  OK to use an antidairrheal to slow down diarrhea.  Call if not better after 2 tries  CALL IF YOU HAVE PROBLEMS/CONCERNS Call if you are still struggling despite following these instructions. Call if you have concerns not answered by these instructions  ######################################################################    1. DIET: Follow a light bland diet & liquids the first 24 hours after arrival home, such as soup, liquids, starches, etc.  Be sure to drink plenty of fluids.  Quickly advance to a usual solid diet within a few days.  Avoid fast food or heavy meals as your are more likely to get nauseated or have irregular bowels.  A low-sugar, high-fiber diet for the rest of your life is ideal.   2. Take your usually prescribed home medications unless otherwise directed.  3. PAIN CONTROL: a. Pain is best controlled by a usual combination of three different methods TOGETHER: i. Ice/Heat ii. Over the counter pain medication iii. Prescription pain medication b. Most patients will experience some swelling and bruising around the hernia(s) such as the bellybutton, groins, or old incisions.  Ice packs or heating pads (30-60 minutes up to 6 times a day) will help. Use ice for the first few days to help decrease swelling and bruising, then switch to heat to help relax tight/sore spots and speed recovery.  Some people prefer to use ice  alone, heat alone, alternating between ice & heat.  Experiment to what works for you.  Swelling and bruising can take several weeks to resolve.   c. It is helpful to take an over-the-counter pain medication regularly for the first few weeks.  Choose one of the following that works best for you: i. Naproxen (Aleve, etc)  Two 220mg  tabs twice a day ii. Ibuprofen (Advil, etc) Three 200mg  tabs four times a day (every meal & bedtime) iii. Acetaminophen (Tylenol, etc) 325-650mg  four times a day (every meal & bedtime) d. A  prescription for pain medication was not provided at this time as you are already taking pain medication for chronic pain.  Take your pain medication as prescribed, if needed.  i. If you are having problems/concerns with the prescription medicine (does not control pain, nausea, vomiting, rash, itching, etc), please call us [phone] to see if we need to switch you to a different pain medicine that will work better for you and/or control your side effect better. ii. If you need a refill on your pain medication, please contact your pharmacy.  They will contact our office to request authorization. Prescriptions will not be filled after 5 pm or on week-ends.  4. Avoid getting constipated.  Between the surgery and the pain medications, it is common to experience some constipation.  Increasing fluid intake and taking a fiber supplement (such as Metamucil, Citrucel, FiberCon, MiraLax, etc) 1-2 times a day regularly will usually help prevent this problem from occurring.  A mild laxative (prune juice, Milk of Magnesia, MiraLax, etc) should be taken according  to package directions if there are no bowel movements after 48 hours.    5. Wash / shower every day, starting 2 days after surgery.  You may shower over the steri strips as they are waterproof.    6. Remove your outer bandage 2 days after surgery. Steri strips will peel off after 1-2 weeks. You may replace a dressing/Band-Aid to cover the  incision for comfort if you wish. You may leave the incisions open to air.  Continue to shower over incision(s) after the dressing is off.  7. ACTIVITIES as tolerated:   a. You may resume regular (light) daily activities beginning the next day--such as daily self-care, walking, climbing stairs--gradually increasing activities as tolerated.  Control your pain so that you can walk an hour a day.  If you can walk 30 minutes without difficulty, it is safe to try more intense activity such as jogging, treadmill, bicycling, low-impact aerobics, swimming, etc. b. Refrain from the most intensive and strenuous activity such as sit-ups, heavy lifting, contact sports, etc  Refrain from any heavy lifting or straining until 6 weeks after surgery.   c. DO NOT PUSH THROUGH PAIN.  Let pain be your guide: If it hurts to do something, don't do it.  Pain is your body warning you to avoid that activity for another week until the pain goes down. d. You may drive when you are no longer taking prescription pain medication, you can comfortably wear a seatbelt, and you can safely maneuver your car and apply brakes. e. Bonita QuinYou may have sexual intercourse when it is comfortable.   8. FOLLOW UP in our office a. Please call [facility] at [phone] to set up an appointment to see your surgeon in the office for a follow-up appointment approximately 2-3 weeks after your surgery. b. Make sure that you call for this appointment the day you arrive home to insure a convenient appointment time.  9.  If you have disability of FMLA / Family leave forms, please bring the forms to the office for processing.  (do not give to your surgeon).  WHEN TO CALL US [phone]: 1. Poor pain control 2. Reactions / problems with new medications (rash/itching, nausea, etc)  3. Fever over 101.5 F (38.5 C) 4. Inability to urinate 5. Nausea and/or vomiting 6. Worsening swelling or bruising 7. Continued bleeding from incision. 8. Increased pain,  redness, or drainage from the incision   The clinic staff is available to answer your questions during regular business hours (8:30am-5pm).  Please dont hesitate to call and ask to speak to one of our nurses for clinical concerns.   If you have a medical emergency, go to the nearest emergency room or call 911.  [facility] is always on call at the hospitals in [facility]  [facility], [place] [address], [address], [place], [place]  [address] ?  [address] [address], [place], [place]   [address] MAIN: [phone] ? TOLL FREE: [phone] ? FAX: [phone] [email or web address]

## 2019-06-12 NOTE — Anesthesia Procedure Notes (Signed)
Procedure Name: Intubation Date/Time: 06/12/2019 7:43 AM Performed by: Adalberto Ill, CRNA Pre-anesthesia Checklist: Patient identified, Emergency Drugs available, Suction available, Patient being monitored and Timeout performed Patient Re-evaluated:Patient Re-evaluated prior to induction Oxygen Delivery Method: Circle system utilized Preoxygenation: Pre-oxygenation with 100% oxygen Induction Type: IV induction Ventilation: Mask ventilation without difficulty Laryngoscope Size: Miller and 2 Grade View: Grade I Tube type: Oral Tube size: 7.5 (ky jelly to cuff ) mm Number of attempts: 1 Airway Equipment and Method: Stylet Placement Confirmation: ETT inserted through vocal cords under direct vision,  positive ETCO2 and breath sounds checked- equal and bilateral Secured at: 22 (at lip) cm Tube secured with: Tape Dental Injury: Teeth and Oropharynx as per pre-operative assessment

## 2019-06-12 NOTE — Transfer of Care (Signed)
Immediate Anesthesia Transfer of Care Note  Patient: Theodore Gonzalez  Procedure(s) Performed: OPEN RIGHT INGUINAL HERNIA REPAIR (Bilateral Inguinal)  Patient Location: PACU  Anesthesia Type:GA combined with regional for post-op pain  Level of Consciousness: awake, alert , oriented and patient cooperative  Airway & Oxygen Therapy: Patient Spontanous Breathing and Patient connected to face mask oxygen  Post-op Assessment: Report given to RN and Post -op Vital signs reviewed and stable  Post vital signs: Reviewed and stable  Last Vitals:  Vitals Value Taken Time  BP 128/67 06/12/19 0950  Temp    Pulse 91 06/12/19 0957  Resp 19 06/12/19 0957  SpO2 95 % 06/12/19 0957  Vitals shown include unvalidated device data.  Last Pain:  Vitals:   06/12/19 0553  PainSc: 0-No pain         Complications: No apparent anesthesia complications

## 2019-06-12 NOTE — Anesthesia Procedure Notes (Signed)
Anesthesia Regional Block: TAP block   Pre-Anesthetic Checklist: ,, timeout performed, Correct Patient, Correct Site, Correct Laterality, Correct Procedure, Correct Position, site marked, Risks and benefits discussed,  Surgical consent,  Pre-op evaluation,  At surgeon's request and post-op pain management  Laterality: Left  Prep: chloraprep       Needles:  Injection technique: Single-shot  Needle Type: Echogenic Stimulator Needle     Needle Length: 9cm  Needle Gauge: 21     Additional Needles:   Procedures:,,,, ultrasound used (permanent image in chart),,,,  Narrative:  Start time: 06/12/2019 7:15 AM End time: 06/12/2019 7:22 AM Injection made incrementally with aspirations every 5 mL.  Performed by: Personally  Anesthesiologist: Effie Berkshire, MD  Additional Notes: Patient tolerated the procedure well. Local anesthetic introduced in an incremental fashion under minimal resistance after negative aspirations. No paresthesias were elicited. After completion of the procedure, no acute issues were identified and patient continued to be monitored by RN.

## 2019-06-12 NOTE — H&P (Signed)
Surgical H&P CC: inguinal hernias  HPI: 63 year old man who presents with bilateral inguinal hernias which were identified on a recent physical exam. He states that he has noted swelling in the right more so than the left groin for quite some time and did not realize it was a hernia until he saw his primary care physician. He denies any GI symptoms. Denies any pain, although he is on chronic pain management secondary to pelvic pain related to extensive pelvic fracture sustained in a work accident in 2012. Denies any urinary symptoms. Has not had any prior abdominal surgery although did require surgical fixation of his pelvic fractures. He is interested in having hernias repaired.  He denies tobacco use. He works [place] the [place] in [employer].  No Known Allergies      Past Medical History:  Diagnosis Date  . Hypertension   . Tobacco abuse          Past Surgical History:  Procedure Laterality Date  . EXTERNAL FIXATION PELVIS  09/10/2011   Procedure: EXTERNAL FIXATION PELVIS;  Surgeon: Vania Rea Supple;  Location: [facility] OR;  Service: Orthopedics;  Laterality: N/A;  . IRRIGATION AND DEBRIDEMENT KNEE  09/10/2011   Procedure: IRRIGATION AND DEBRIDEMENT KNEE;  Surgeon: Vania Rea Supple;  Location: [facility] OR;  Service: Orthopedics;  Laterality: Left;  . ORIF ANKLE FRACTURE  09/14/2011   Procedure: OPEN REDUCTION INTERNAL FIXATION (ORIF) ANKLE FRACTURE;  Surgeon: Budd Palmer;  Location: [facility] OR;  Service: Orthopedics;  Laterality: Right;  . ORIF PELVIC FRACTURE  09/14/2011   Procedure: OPEN REDUCTION INTERNAL FIXATION (ORIF) PELVIC FRACTURE;  Surgeon: Budd Palmer;  Location: [facility] OR;  Service: Orthopedics;  Laterality: N/A;  . SHOULDER ARTHROSCOPY  03/25/2012   Procedure: ARTHROSCOPY SHOULDER;  Surgeon: Mable Paris, MD;  Location: [facility];  Service: Orthopedics;  Laterality: Right;  Right Shoulder Arthroscopy with Capsular Release and Manipulation with  Debridement of Irreparable Rotator Cuff Tear    No family history on file.  Social History        Socioeconomic History  . Marital status: Married    Spouse name: Not on file  . Number of children: Not on file  . Years of education: Not on file  . Highest education level: Not on file  Occupational History  . Not on file  Social Needs  . Financial resource strain: Not on file  . Food insecurity    Worry: Not on file    Inability: Not on file  . Transportation needs    Medical: Not on file    Non-medical: Not on file  Tobacco Use  . Smoking status: Former Smoker    Packs/day: 0.25    Years: 20.00    Pack years: 5.00    Types: Cigarettes    Start date: 09/02/1968    Quit date: 09/25/1987    Years since quitting: 31.5  . Smokeless tobacco: Never Used  Substance and Sexual Activity  . Alcohol use: No  . Drug use: No  . Sexual activity: Not on file  Lifestyle  . Physical activity    Days per week: Not on file    Minutes per session: Not on file  . Stress: Not on file  Relationships  . Social Musician on phone: Not on file    Gets together: Not on file    Attends religious service: Not on file    Active member of club or organization: Not on file  Attends meetings of clubs or organizations: Not on file    Relationship status: Not on file  Other Topics Concern  . Not on file  Social History Narrative   Originally from [place]. Came to the [place] prior to [place].   Lives with his wife.   Adult children from a previous marriage live independently in [place].          Current Outpatient Medications on File Prior to Visit  Medication Sig Dispense Refill  . atorvastatin (LIPITOR) 40 MG tablet Take 40 mg by mouth daily.      Marland Kitchen. escitalopram (LEXAPRO) 10 MG tablet Take by mouth.    . gabapentin (NEURONTIN) 300 MG capsule 3 (three) times daily. Take on    . ipratropium (ATROVENT) 0.06 % nasal spray Place 2 sprays  into both nostrils 4 (four) times daily. 15 mL 1  . lisinopril (PRINIVIL,ZESTRIL) 5 MG tablet TAKE ONE TABLET (5 MG TOTAL) BY MOUTH DAILY.    Marland Kitchen. omeprazole (PRILOSEC) 20 MG capsule Take 1 capsule (20 mg total) by mouth daily. 30 capsule 3  . oxyCODONE (ROXICODONE) 15 MG immediate release tablet Take by mouth.    . valACYclovir (VALTREX) 500 MG tablet Take 1 tablet (500 mg total) by mouth 2 (two) times daily. 4 tablet 3   No current facility-administered medications on file prior to visit.     Review of Systems: a complete, 10pt review of systems was completed with pertinent positives and negatives as documented in the HPI  Physical Exam: 04/16/2019 9:53 AM Weight: 214.38 lb Height: 67in Body Surface Area: 2.08 m Body Mass Index: 33.58 kg/m  Temp.: 98.63F  Pulse: 54 (Regular)  P.OX: 95% (Room air) BP: 128/72 (Sitting, Left Arm, Standard)  Gen: alert and well appearing Eye: extraocular motion intact, no scleral icterus ENT: moist mucus membranes, dentition intact Neck: no mass or thyromegaly Chest: unlabored respirations, symmetrical air entry, clear bilaterally CV: regular rate and rhythm, no pedal edema Abdomen: soft, nontender, nondistended. No mass or organomegaly. Large but reducible right inguinal hernia, likely direct. Moderate reducible direct left inguinal hernia. MSK: strength symmetrical throughout, no deformity Neuro: grossly intact, normal gait Psych: normal mood and affect, appropriate insight Skin: warm and dry, no rash or lesion on limited exam   CBC Latest Ref Rng & Units 03/25/2012 09/26/2011 09/20/2011  WBC 4.0 - 10.5 K/uL - 6.9 8.2  Hemoglobin 13.0 - 17.0 g/dL 29.513.9 11.2(L) 10.2(L)  Hematocrit 39.0 - 52.0 % 41.0 34.8(L) 30.8(L)  Platelets 150 - 400 K/uL - 636(H) 354    CMP Latest Ref Rng & Units 03/25/2012 09/26/2011 09/20/2011  Glucose 70 - 99 mg/dL 621(H108(H) 97 086(V121(H)  BUN 6 - 23 mg/dL 14 14 16   Creatinine 0.50 - 1.35 mg/dL 7.840.70 6.960.62 2.950.56   Sodium 135 - 145 mEq/L 140 131(L) 134(L)  Potassium 3.5 - 5.1 mEq/L 3.5 4.2 3.6  Chloride 96 - 112 mEq/L 104 96 97  CO2 19 - 32 mEq/L - 27 29  Calcium 8.4 - 10.5 mg/dL - 8.6 2.8(U8.3(L)  Total Protein 6.0 - 8.3 g/dL - 6.6 -  Total Bilirubin 0.3 - 1.2 mg/dL - 1.0 -  Alkaline Phos 39 - 117 U/L - 95 -  AST 0 - 37 U/L - 22 -  ALT 0 - 53 U/L - 33 -    Recent Labs       Lab Results  Component Value Date   INR 3.10 (H) 09/27/2011   INR 3.75 (H) 09/26/2011   INR 3.86 (  H) 09/25/2011      Imaging: Imaging Results (Last 48 hours)  No results found.      A/P: BILATERAL INGUINAL HERNIA (K40.20) Story: Minimally symptomatic, reducible. Patient desires repair. I recommend proceeding with an open approach given the size of the hernias. Discussed risks of bleeding, infection, pain, scarring, injury to structures in the area including blood vessels of the testicle, bladder, nerves, bowel, etc., risk of chronic pain, risk of hernia recurrence. Discussed risks of general anesthesia including heart attack, pulmonary and vascular/blood clots. Discussed need to avoid any heavy lifting or shininess activity for 6 weeks. Questions were answered. He wishes to proceed with surgery.   Romana Juniper, MD [facility], [place] Pager [phone]

## 2019-06-12 NOTE — Anesthesia Procedure Notes (Signed)
Anesthesia Regional Block: TAP block   Pre-Anesthetic Checklist: ,, timeout performed, Correct Patient, Correct Site, Correct Laterality, Correct Procedure, Correct Position, site marked, Risks and benefits discussed,  Surgical consent,  Pre-op evaluation,  At surgeon's request and post-op pain management  Laterality: Right  Prep: chloraprep       Needles:  Injection technique: Single-shot  Needle Type: Echogenic Stimulator Needle     Needle Length: 9cm  Needle Gauge: 21     Additional Needles:   Procedures:,,,, ultrasound used (permanent image in chart),,,,  Narrative:  Start time: 06/12/2019 7:23 AM End time: 06/12/2019 7:28 AM Injection made incrementally with aspirations every 5 mL.  Performed by: Personally  Anesthesiologist: Effie Berkshire, MD  Additional Notes: Patient tolerated the procedure well. Local anesthetic introduced in an incremental fashion under minimal resistance after negative aspirations. No paresthesias were elicited. After completion of the procedure, no acute issues were identified and patient continued to be monitored by RN.

## 2019-06-12 NOTE — Op Note (Signed)
Operative Note  Theodore Gonzalez  [id number]  [id number]  06/12/2019   Surgeon: Vikki Ports A ConnorMD  Assistant: OR staff  Procedure performed: Open repair of traumatic right groin hernia  Preop diagnosis: Bilateral inguinal hernia Post-op diagnosis/intraop findings: Traumatic right groin hernia, extensive scarring with fusion of the planes medially from prior trauma  Specimens: None Retained items: None EBL: Minimal cc Complications: none  Description of procedure: After obtaining informed consent and bilateral taps block by Dr. Smith Robert in holding the patient was taken to the operating room and placed supine on operating room table wheregeneral endotracheal anesthesia was initiated, preoperative antibiotics were administered, SCDs applied, and a formal timeout was performed.  Foley catheter was inserted which is removed at the end of the case.  [name] lower abdomen and bilateral groins were clipped, prepped and draped in the usual sterile fashion.  We began on the right side which was the symptomatic side.  An oblique incision was made in the right groin and the soft tissues dissected with cautery down to the external oblique which was cleared off.  Laterally the anatomy was normal, however medially there was extensive scar tissue involving the Scarpa's and external/internal oblique with essentially fusion of the planes.  I was able to clear off the inguinal ligament over the femoral canal extending towards the external ring.  As we proceeded, the hernia was visualized and was in fact a traumatic abdominal wall hernia through the external oblique medially, several centimeters cephalad to the external ring.  I divided the external oblique laterally and attempted to develop the plane between the internal and external oblique around the hernia, however beginning just lateral to the hernia the tissues are completely fused.  The hernia sac was cleared off circumferentially and bowel was noted  within the hernia.  The preperitoneal space was similarly infiltrated with scar tissue but I was able to free up the hernia sac circumferentially to the point that the hernia could be reduced into the abdomen. The defect was then closed with interrupted #1 Novafil sutures.  The portion of external oblique I had divided was reapproximated with a running 3-0 Vicryl.  Hemostasis was ensured within the wound.  Exparel was infiltrated in the plane just below the external oblique and the soft tissues circumferentially.  The Scarpa's was reapproximated with interrupted 3-0 Vicryls and the skin was closed with running subcuticular Monocryl.  Benzoin Steri-Strips and a dry bandage were applied.  As the left side is asymptomatic and given the unexpected findings on the right side, I did not proceed with repair of the left side at this time.  The patient was then awakened, extubated and taken to PACU in stable condition.   All counts were correct at the completion of the case.

## 2019-06-12 NOTE — Anesthesia Postprocedure Evaluation (Signed)
Anesthesia Post Note  Patient: Theodore Gonzalez  Procedure(s) Performed: OPEN RIGHT INGUINAL HERNIA REPAIR (Bilateral Inguinal)     Patient location during evaluation: PACU Anesthesia Type: General Level of consciousness: awake and alert Pain management: pain level controlled Vital Signs Assessment: post-procedure vital signs reviewed and stable Respiratory status: spontaneous breathing, nonlabored ventilation, respiratory function stable and patient connected to nasal cannula oxygen Cardiovascular status: blood pressure returned to baseline and stable Postop Assessment: no apparent nausea or vomiting Anesthetic complications: no    Last Vitals:  Vitals:   06/12/19 1046 06/12/19 1107  BP: 128/84 134/82  Pulse: 91 91  Resp: 16 16  Temp:  (!) 36.3 C  SpO2: 100% 90%    Last Pain:  Vitals:   06/12/19 1107  PainSc: [place] Ashiyah Pavlak

## 2019-06-13 ENCOUNTER — Encounter (HOSPITAL_COMMUNITY): Payer: Self-pay | Admitting: Surgery

## 2019-06-23 DIAGNOSIS — Z9889 Other specified postprocedural states: Secondary | ICD-10-CM | POA: Diagnosis not present

## 2019-06-23 DIAGNOSIS — G894 Chronic pain syndrome: Secondary | ICD-10-CM | POA: Diagnosis not present

## 2019-06-23 DIAGNOSIS — M25572 Pain in left ankle and joints of left foot: Secondary | ICD-10-CM | POA: Diagnosis not present

## 2019-06-23 DIAGNOSIS — G8929 Other chronic pain: Secondary | ICD-10-CM | POA: Diagnosis not present

## 2019-07-13 ENCOUNTER — Ambulatory Visit (INDEPENDENT_AMBULATORY_CARE_PROVIDER_SITE_OTHER): Payer: BC Managed Care – PPO | Admitting: Family Medicine

## 2019-07-13 ENCOUNTER — Encounter: Payer: Self-pay | Admitting: Family Medicine

## 2019-07-13 DIAGNOSIS — J069 Acute upper respiratory infection, unspecified: Secondary | ICD-10-CM | POA: Diagnosis not present

## 2019-07-13 MED ORDER — FLUTICASONE PROPIONATE 50 MCG/ACT NA SUSP: 2.0000 | Freq: Every day | NASAL | 6 refills | Status: DC

## 2019-07-13 MED ORDER — CHLORPHEN-PE-ACETAMINOPHEN 4-10-325 MG PO TABS: 1.0000 | ORAL_TABLET | Freq: Four times a day (QID) | ORAL | 0 refills | Status: DC | PRN

## 2019-07-13 MED ORDER — AZITHROMYCIN 250 MG PO TABS: ORAL_TABLET | ORAL | 0 refills | Status: DC

## 2019-07-13 NOTE — Progress Notes (Signed)
Virtual Visit via telephone Note Due to COVID-19 pandemic this visit was conducted virtually. This visit type was conducted due to national recommendations for restrictions regarding the COVID-19 Pandemic (e.g. social distancing, sheltering in place) in an effort to limit this patient's exposure and mitigate transmission in our community. All issues noted in this document were discussed and addressed.  A physical exam was not performed with this format.   I connected with Theodore Gonzalez on 07/13/19 at 0900 by telephone and verified that I am speaking with the correct person using two identifiers. Theodore Gonzalez is currently located at home and family is currently with them during visit. The provider, Kari Baars, FNP is located in their office at time of visit.  I discussed the limitations, risks, security and privacy concerns of performing an evaluation and management service by telephone and the availability of in person appointments. I also discussed with the patient that there may be a patient responsible charge related to this service. The patient expressed understanding and agreed to proceed.  Subjective:  Patient ID: Theodore Gonzalez, male    DOB: [birth year], 63 y.o.   MRN: [id number]  Chief Complaint:  URI   HPI: Theodore Gonzalez is a 63 y.o. male presenting on 07/13/2019 for URI   Pt reports ongoing cough, congestion, rhinorrhea, and postnasal drip. Pt has been taking Advil cold and sinus without relief of symptoms. Pt states he has had chills but has not measured his temperature. Pt denies known sick exposures.   URI  This is a new problem. The current episode started 1 to 4 weeks ago. The problem has been gradually worsening. There has been no fever. Associated symptoms include congestion, coughing, ear pain, headaches, a plugged ear sensation, rhinorrhea, sinus pain, a sore throat and swollen glands. Pertinent negatives include no abdominal pain, chest pain, diarrhea, dysuria, joint pain,  joint swelling, nausea, neck pain, rash, sneezing, vomiting or wheezing. He has tried decongestant and NSAIDs for the symptoms. The treatment provided no relief.     Relevant past medical, surgical, family, and social history reviewed and updated as indicated.  Allergies and medications reviewed and updated.   Past Medical History:  Diagnosis Date  . Hypertension   . Tobacco abuse     Past Surgical History:  Procedure Laterality Date  . EXTERNAL FIXATION PELVIS  09/10/2011   Procedure: EXTERNAL FIXATION PELVIS;  Surgeon: Vania Rea Supple;  Location: [facility];  Service: Orthopedics;  Laterality: N/A;  . INGUINAL HERNIA REPAIR Bilateral 06/12/2019   Procedure: OPEN RIGHT INGUINAL HERNIA REPAIR;  Surgeon: Berna Bue, MD;  Location: [facility];  Service: General;  Laterality: Bilateral;  . IRRIGATION AND DEBRIDEMENT KNEE  09/10/2011   Procedure: IRRIGATION AND DEBRIDEMENT KNEE;  Surgeon: Vania Rea Supple;  Location: [facility];  Service: Orthopedics;  Laterality: Left;  . ORIF ANKLE FRACTURE  09/14/2011   Procedure: OPEN REDUCTION INTERNAL FIXATION (ORIF) ANKLE FRACTURE;  Surgeon: Budd Palmer;  Location: [facility];  Service: Orthopedics;  Laterality: Right;  . ORIF PELVIC FRACTURE  09/14/2011   Procedure: OPEN REDUCTION INTERNAL FIXATION (ORIF) PELVIC FRACTURE;  Surgeon: Budd Palmer;  Location: [facility];  Service: Orthopedics;  Laterality: N/A;  . SHOULDER ARTHROSCOPY  03/25/2012   Procedure: ARTHROSCOPY SHOULDER;  Surgeon: Mable Paris, MD;  Location: [facility];  Service: Orthopedics;  Laterality: Right;  Right Shoulder Arthroscopy with Capsular Release and Manipulation with Debridement of Irreparable Rotator Cuff Tear    Social  History   Socioeconomic History  . Marital status: Married    Spouse name: Not on file  . Number of children: Not on file  . Years of education: Not on file  . Highest education level: Not on file  Occupational History  . Not on file   Social Needs  . Financial resource strain: Not on file  . Food insecurity    Worry: Not on file    Inability: Not on file  . Transportation needs    Medical: Not on file    Non-medical: Not on file  Tobacco Use  . Smoking status: Former Smoker    Packs/day: 0.25    Years: 20.00    Pack years: 5.00    Types: Cigarettes    Start date: 09/02/1968    Quit date: 09/25/1987    Years since quitting: 31.8  . Smokeless tobacco: Never Used  Substance and Sexual Activity  . Alcohol use: No  . Drug use: No  . Sexual activity: Not on file  Lifestyle  . Physical activity    Days per week: Not on file    Minutes per session: Not on file  . Stress: Not on file  Relationships  . Social Herbalist on phone: Not on file    Gets together: Not on file    Attends religious service: Not on file    Active member of club or organization: Not on file    Attends meetings of clubs or organizations: Not on file    Relationship status: Not on file  . Intimate partner violence    Fear of current or ex partner: Not on file    Emotionally abused: Not on file    Physically abused: Not on file    Forced sexual activity: Not on file  Other Topics Concern  . Not on file  Social History Narrative   Originally from [place]. Came to the [place] prior to 1987.   Lives with his wife.   Adult children from a previous marriage live independently in [place].    Outpatient Encounter Medications as of 07/13/2019  Medication Sig  . acetaminophen (TYLENOL) 500 MG tablet Take 2 tablets (1,000 mg total) by mouth every 6 (six) hours as needed. Alternate tylenol and ibuprofen around the clock for the first 3-4 days after surgery, then take as needed.  Marland Kitchen atorvastatin (LIPITOR) 40 MG tablet Take 40 mg by mouth daily.    Marland Kitchen azithromycin (ZITHROMAX Z-PAK) 250 MG tablet As directed  . Chlorphen-PE-Acetaminophen 4-10-325 MG TABS Take 1 tablet by mouth every 6 (six) hours as needed.  Marland Kitchen escitalopram (LEXAPRO) 10 MG tablet  Take 1.5 tablets (15 mg total) by mouth daily.  . fluticasone (FLONASE) 50 MCG/ACT nasal spray Place 2 sprays into both nostrils daily.  Marland Kitchen gabapentin (NEURONTIN) 300 MG capsule Take 300 mg by mouth 2 (two) times daily.   Marland Kitchen ibuprofen (MOTRIN IB) 200 MG tablet Take 1-2 tablets (200-400 mg total) by mouth every 6 (six) hours as needed. Alternate tylenol and ibuprofen around the clock for the first 3-4 days, then take as needed  . lisinopril (ZESTRIL) 5 MG tablet TAKE ONE TABLET (5 MG TOTAL) BY MOUTH DAILY. (Patient taking differently: Take 5 mg by mouth daily. )  . omeprazole (PRILOSEC) 20 MG capsule Take 1 capsule (20 mg total) by mouth daily.  Marland Kitchen oxyCODONE (ROXICODONE) 15 MG immediate release tablet Take 15 mg by mouth every 6 (six) hours as needed for pain.  Marland Kitchen  valACYclovir (VALTREX) 500 MG tablet Take 1 tablet (500 mg total) by mouth 2 (two) times daily.   No facility-administered encounter medications on file as of 07/13/2019.     No Known Allergies  Review of Systems  Constitutional: Positive for activity change, appetite change, chills and fatigue. Negative for diaphoresis, fever and unexpected weight change.  HENT: Positive for congestion, ear pain, postnasal drip, rhinorrhea, sinus pressure, sinus pain and sore throat. Negative for sneezing.   Eyes: Negative.  Negative for photophobia and visual disturbance.  Respiratory: Positive for cough. Negative for chest tightness, shortness of breath and wheezing.   Cardiovascular: Negative for chest pain, palpitations and leg swelling.  Gastrointestinal: Negative for abdominal pain, blood in stool, constipation, diarrhea, nausea and vomiting.  Endocrine: Negative.   Genitourinary: Negative for decreased urine volume, difficulty urinating, dysuria, frequency and urgency.  Musculoskeletal: Negative for arthralgias, joint pain, myalgias and neck pain.  Skin: Negative.  Negative for rash.  Allergic/Immunologic: Negative.   Neurological: Positive for  headaches. Negative for dizziness and weakness.  Hematological: Negative.   Psychiatric/Behavioral: Negative for confusion, hallucinations, sleep disturbance and suicidal ideas.  All other systems reviewed and are negative.        Observations/Objective: No vital signs or physical exam, this was a telephone or virtual health encounter.  Pt alert and oriented, answers all questions appropriately, and able to speak in full sentences.    Assessment and Plan: Theodore Gonzalez was seen today for uri.  Diagnoses and all orders for this visit:  URI with cough and congestion Ongoing and worsening URI symptoms that have failed symptomatic care. Will initiate Zithromax. Continue symptomatic care. Increase fluid intake. Report any new, worsening, or persistent symptoms.  -     Chlorphen-PE-Acetaminophen 4-10-325 MG TABS; Take 1 tablet by mouth every 6 (six) hours as needed. -     fluticasone (FLONASE) 50 MCG/ACT nasal spray; Place 2 sprays into both nostrils daily. -     azithromycin (ZITHROMAX Z-PAK) 250 MG tablet; As directed     Follow Up Instructions: Return if symptoms worsen or fail to improve.    I discussed the assessment and treatment plan with the patient. The patient was provided an opportunity to ask questions and all were answered. The patient agreed with the plan and demonstrated an understanding of the instructions.   The patient was advised to call back or seek an in-person evaluation if the symptoms worsen or if the condition fails to improve as anticipated.  The above assessment and management plan was discussed with the patient. The patient verbalized understanding of and has agreed to the management plan. Patient is aware to call the clinic if they develop any new symptoms or if symptoms persist or worsen. Patient is aware when to return to the clinic for a follow-up visit. Patient educated on when it is appropriate to go to the emergency department.    I provided 15 minutes of  non-face-to-face time during this encounter. The call started at 0900. The call ended at 0915. The other time was used for coordination of care.    Kari BaarsMichelle Jaydin Jalomo, FNP-C [facility] [address] [place], [place] [id number] [phone] 07/13/19

## 2019-07-16 DIAGNOSIS — G4733 Obstructive sleep apnea (adult) (pediatric): Secondary | ICD-10-CM | POA: Diagnosis not present

## 2019-07-20 ENCOUNTER — Telehealth: Payer: Self-pay | Admitting: Family Medicine

## 2019-07-20 NOTE — Telephone Encounter (Signed)
lmtcb

## 2019-07-21 ENCOUNTER — Ambulatory Visit (INDEPENDENT_AMBULATORY_CARE_PROVIDER_SITE_OTHER): Payer: BC Managed Care – PPO | Admitting: Family Medicine

## 2019-07-21 DIAGNOSIS — J069 Acute upper respiratory infection, unspecified: Secondary | ICD-10-CM | POA: Diagnosis not present

## 2019-07-21 NOTE — Patient Instructions (Signed)
Prevent the Spread of COVID-19 if You Are Sick If you are sick with COVID-19 or think you might have COVID-19, follow the steps below to help protect other people in your home and community. Stay home except to get medical care.  Stay home. Most people with COVID-19 have mild illness and are able to recover at home without medical care. Do not leave your home, except to get medical care. Do not visit public areas.  Take care of yourself. Get rest and stay hydrated.  Get medical care when needed. Call your doctor before you go to their office for care. But, if you have trouble breathing or other concerning symptoms, call 911 for immediate help.  Avoid public transportation, ride-sharing, or taxis. Separate yourself from other people and pets in your home.  As much as possible, stay in a specific room and away from other people and pets in your home. Also, you should use a separate bathroom, if available. If you need to be around other people or animals in or outside of the home, wear a cloth face covering. ? See COVID-19 and Animals if you have questions about pets: https://www.cdc.gov/coronavirus/2019-ncov/faq.html#COVID19animals Monitor your symptoms.  Common symptoms of COVID-19 include fever and cough. Trouble breathing is a more serious symptom that means you should get medical attention.  Follow care instructions from your healthcare provider and local health department. Your local health authorities will give instructions on checking your symptoms and reporting information. If you develop emergency warning signs for COVID-19 get medical attention immediately.  Emergency warning signs include*:  Trouble breathing  Persistent pain or pressure in the chest  New confusion or not able to be woken  Bluish lips or face *This list is not all inclusive. Please consult your medical provider for any other symptoms that are severe or concerning to you. Call 911 if you have a medical  emergency. If you have a medical emergency and need to call 911, notify the operator that you have or think you might have, COVID-19. If possible, put on a facemask before medical help arrives. Call ahead before visiting your doctor.  Call ahead. Many medical visits for routine care are being postponed or done by phone or telemedicine.  If you have a medical appointment that cannot be postponed, call your doctor's office. This will help the office protect themselves and other patients. If you are sick, wear a cloth covering over your nose and mouth.  You should wear a cloth face covering over your nose and mouth if you must be around other people or animals, including pets (even at home).  You don't need to wear the cloth face covering if you are alone. If you can't put on a cloth face covering (because of trouble breathing for example), cover your coughs and sneezes in some other way. Try to stay at least 6 feet away from other people. This will help protect the people around you. Note: During the COVID-19 pandemic, medical grade facemasks are reserved for healthcare workers and some first responders. You may need to make a cloth face covering using a scarf or bandana. Cover your coughs and sneezes.  Cover your mouth and nose with a tissue when you cough or sneeze.  Throw used tissues in a lined trash can.  Immediately wash your hands with soap and water for at least 20 seconds. If soap and water are not available, clean your hands with an alcohol-based hand sanitizer that contains at least 60% alcohol. Clean your hands often.    Wash your hands often with soap and water for at least 20 seconds. This is especially important after blowing your nose, coughing, or sneezing; going to the bathroom; and before eating or preparing food.  Use hand sanitizer if soap and water are not available. Use an alcohol-based hand sanitizer with at least 60% alcohol, covering all surfaces of your hands and rubbing  them together until they feel dry.  Soap and water are the best option, especially if your hands are visibly dirty.  Avoid touching your eyes, nose, and mouth with unwashed hands. Avoid sharing personal household items.  Do not share dishes, drinking glasses, cups, eating utensils, towels, or bedding with other people in your home.  Wash these items thoroughly after using them with soap and water or put them in the dishwasher. Clean all "high-touch" surfaces everyday.  Clean and disinfect high-touch surfaces in your "sick room" and bathroom. Let someone else clean and disinfect surfaces in common areas, but not your bedroom and bathroom.  If a caregiver or other person needs to clean and disinfect a sick person's bedroom or bathroom, they should do so on an as-needed basis. The caregiver/other person should wear a mask and wait as long as possible after the sick person has used the bathroom. High-touch surfaces include phones, remote controls, counters, tabletops, doorknobs, bathroom fixtures, toilets, keyboards, tablets, and bedside tables.  Clean and disinfect areas that may have blood, stool, or body fluids on them.  Use household cleaners and disinfectants. Clean the area or item with soap and water or another detergent if it is dirty. Then use a household disinfectant. ? Be sure to follow the instructions on the label to ensure safe and effective use of the product. Many products recommend keeping the surface wet for several minutes to ensure germs are killed. Many also recommend precautions such as wearing gloves and making sure you have good ventilation during use of the product. ? Most EPA-registered household disinfectants should be effective. How to discontinue home isolation  People with COVID-19 who have stayed home (home isolated) can stop home isolation under the following conditions: ? If you will not have a test to determine if you are still contagious, you can leave home  after these three things have happened:  You have had no fever for at least 72 hours (that is three full days of no fever without the use of medicine that reduces fevers) AND  other symptoms have improved (for example, when your cough or shortness of breath has improved) AND  at least 10 days have passed since your symptoms first appeared. ? If you will be tested to determine if you are still contagious, you can leave home after these three things have happened:  You no longer have a fever (without the use of medicine that reduces fevers) AND  other symptoms have improved (for example, when your cough or shortness of breath has improved) AND  you received two negative tests in a row, 24 hours apart. Your doctor will follow CDC guidelines. In all cases, follow the guidance of your healthcare provider and local health department. The decision to stop home isolation should be made in consultation with your healthcare provider and state and local health departments. Local decisions depend on local circumstances. cdc.gov/coronavirus 01/25/2019 This information is not intended to replace advice given to you by your health care provider. Make sure you discuss any questions you have with your health care provider. Document Released: 01/06/2019 Document Revised: 02/04/2019 Document Reviewed: 01/06/2019   Elsevier Patient Education  2020 Elsevier Inc.  

## 2019-07-21 NOTE — Progress Notes (Signed)
Telephone visit  Subjective: CC: Cough PCP: Janora Norlander, DO SJG:GEZM Theodore Gonzalez is a 63 y.o. male calls for telephone consult today. Patient provides verbal consent for consult held via phone.  Location of patient: Work Location of provider: Working remotely from home Others present for call: None  1.  Cough and congestion Patient reports ongoing cough and congestion with associated rhinorrhea.  Symptoms are refractory to use of Z-Pak and over-the-counter cough and cold medications.  He denies any fevers, nausea, vomiting, shortness of breath or hemoptysis.   ROS: Per HPI  No Known Allergies Past Medical History:  Diagnosis Date  . Hypertension   . Tobacco abuse     Current Outpatient Medications:  .  acetaminophen (TYLENOL) 500 MG tablet, Take 2 tablets (1,000 mg total) by mouth every 6 (six) hours as needed. Alternate tylenol and ibuprofen around the clock for the first 3-4 days after surgery, then take as needed., Disp: 100 tablet, Rfl: 0 .  atorvastatin (LIPITOR) 40 MG tablet, Take 40 mg by mouth daily.  , Disp: , Rfl:  .  azithromycin (ZITHROMAX Z-PAK) 250 MG tablet, As directed, Disp: 6 tablet, Rfl: 0 .  Chlorphen-PE-Acetaminophen 4-10-325 MG TABS, Take 1 tablet by mouth every 6 (six) hours as needed., Disp: 20 tablet, Rfl: 0 .  escitalopram (LEXAPRO) 10 MG tablet, Take 1.5 tablets (15 mg total) by mouth daily., Disp: 135 tablet, Rfl: 1 .  fluticasone (FLONASE) 50 MCG/ACT nasal spray, Place 2 sprays into both nostrils daily., Disp: 16 g, Rfl: 6 .  gabapentin (NEURONTIN) 300 MG capsule, Take 300 mg by mouth 2 (two) times daily. , Disp: , Rfl:  .  ibuprofen (MOTRIN IB) 200 MG tablet, Take 1-2 tablets (200-400 mg total) by mouth every 6 (six) hours as needed. Alternate tylenol and ibuprofen around the clock for the first 3-4 days, then take as needed, Disp: 100 tablet, Rfl: 0 .  lisinopril (ZESTRIL) 5 MG tablet, TAKE ONE TABLET (5 MG TOTAL) BY MOUTH DAILY. (Patient taking  differently: Take 5 mg by mouth daily. ), Disp: 90 tablet, Rfl: 1 .  omeprazole (PRILOSEC) 20 MG capsule, Take 1 capsule (20 mg total) by mouth daily., Disp: 30 capsule, Rfl: 3 .  oxyCODONE (ROXICODONE) 15 MG immediate release tablet, Take 15 mg by mouth every 6 (six) hours as needed for pain., Disp: , Rfl:  .  valACYclovir (VALTREX) 500 MG tablet, Take 1 tablet (500 mg total) by mouth 2 (two) times daily., Disp: 4 tablet, Rfl: 3  Assessment/ Plan: 63 y.o. male   1. URI with cough and congestion Patient with ongoing URI symptoms despite recent treatment with oral antibiotics.  I have ordered a coronavirus test.  He had a previous negative test in September but again the symptoms have developed since that inguinal repair in September.  He will go to the [facility] location for testing.  I offered Tessalon Perles for cough but patient would like to continue OTC cough medication.  He understands red flag signs and symptoms warranting further evaluation emergency department.  He will follow-up as needed - Novel Coronavirus, [facility] ([facility])   Start time: 3:43pm (LVM); 3:55p End time: 4:00p  Total time spent on patient care (including telephone call/ virtual visit): 10 minutes  [place], [place] [phone]

## 2019-07-21 NOTE — Telephone Encounter (Signed)
appt made today for 10/27

## 2019-07-22 ENCOUNTER — Other Ambulatory Visit: Payer: Self-pay

## 2019-07-22 DIAGNOSIS — Z20822 Contact with and (suspected) exposure to covid-19: Secondary | ICD-10-CM

## 2019-07-23 LAB — NOVEL CORONAVIRUS, NAA: SARS-CoV-2, NAA: NOT DETECTED

## 2019-07-27 ENCOUNTER — Telehealth: Payer: Self-pay | Admitting: Family Medicine

## 2019-07-27 NOTE — Telephone Encounter (Signed)
Note wrote

## 2019-07-27 NOTE — Telephone Encounter (Signed)
Okay for note

## 2019-07-27 NOTE — Telephone Encounter (Signed)
lmtcb

## 2019-07-27 NOTE — Telephone Encounter (Signed)
Called and left message to let patient know results are negative. Need to know exactly what note needs to say

## 2019-07-27 NOTE — Telephone Encounter (Signed)
Pt needs to be excused from 07/22/2019-07/24/2019 because of pending covid test, he is negative and ok to go back to work. Pt went back to work today.

## 2019-07-27 NOTE — Telephone Encounter (Signed)
Ok to provide

## 2019-07-28 NOTE — Telephone Encounter (Signed)
Patient picked up.

## 2019-07-29 ENCOUNTER — Other Ambulatory Visit: Payer: Self-pay | Admitting: Family Medicine

## 2019-07-29 DIAGNOSIS — K219 Gastro-esophageal reflux disease without esophagitis: Secondary | ICD-10-CM

## 2019-07-30 DIAGNOSIS — E669 Obesity, unspecified: Secondary | ICD-10-CM | POA: Diagnosis not present

## 2019-07-30 DIAGNOSIS — Z713 Dietary counseling and surveillance: Secondary | ICD-10-CM | POA: Diagnosis not present

## 2019-07-30 DIAGNOSIS — E7800 Pure hypercholesterolemia, unspecified: Secondary | ICD-10-CM | POA: Diagnosis not present

## 2019-08-08 ENCOUNTER — Other Ambulatory Visit: Payer: Self-pay | Admitting: Physician Assistant

## 2019-08-18 DIAGNOSIS — G894 Chronic pain syndrome: Secondary | ICD-10-CM | POA: Diagnosis not present

## 2019-08-18 DIAGNOSIS — G8929 Other chronic pain: Secondary | ICD-10-CM | POA: Diagnosis not present

## 2019-08-18 DIAGNOSIS — Z9889 Other specified postprocedural states: Secondary | ICD-10-CM | POA: Diagnosis not present

## 2019-08-18 DIAGNOSIS — M25572 Pain in left ankle and joints of left foot: Secondary | ICD-10-CM | POA: Diagnosis not present

## 2019-08-26 DIAGNOSIS — E7800 Pure hypercholesterolemia, unspecified: Secondary | ICD-10-CM | POA: Diagnosis not present

## 2019-08-26 DIAGNOSIS — E669 Obesity, unspecified: Secondary | ICD-10-CM | POA: Diagnosis not present

## 2019-08-26 DIAGNOSIS — Z713 Dietary counseling and surveillance: Secondary | ICD-10-CM | POA: Diagnosis not present

## 2019-10-31 ENCOUNTER — Other Ambulatory Visit: Payer: Self-pay | Admitting: Family Medicine

## 2019-11-09 ENCOUNTER — Other Ambulatory Visit: Payer: Self-pay | Admitting: Family Medicine

## 2019-11-09 NOTE — Telephone Encounter (Signed)
Last office visit 07/21/19

## 2019-11-11 ENCOUNTER — Ambulatory Visit (INDEPENDENT_AMBULATORY_CARE_PROVIDER_SITE_OTHER): Payer: 59 | Admitting: Family Medicine

## 2019-11-11 ENCOUNTER — Ambulatory Visit (INDEPENDENT_AMBULATORY_CARE_PROVIDER_SITE_OTHER): Payer: 59

## 2019-11-11 ENCOUNTER — Ambulatory Visit: Payer: BC Managed Care – PPO | Admitting: Family Medicine

## 2019-11-11 ENCOUNTER — Other Ambulatory Visit: Payer: Self-pay

## 2019-11-11 VITALS — BP 121/72 | HR 80 | Temp 98.4°F | Ht 66.0 in | Wt 217.0 lb

## 2019-11-11 DIAGNOSIS — N521 Erectile dysfunction due to diseases classified elsewhere: Secondary | ICD-10-CM | POA: Diagnosis not present

## 2019-11-11 DIAGNOSIS — R61 Generalized hyperhidrosis: Secondary | ICD-10-CM

## 2019-11-11 DIAGNOSIS — E782 Mixed hyperlipidemia: Secondary | ICD-10-CM

## 2019-11-11 DIAGNOSIS — Z114 Encounter for screening for human immunodeficiency virus [HIV]: Secondary | ICD-10-CM | POA: Diagnosis not present

## 2019-11-11 DIAGNOSIS — Z1159 Encounter for screening for other viral diseases: Secondary | ICD-10-CM

## 2019-11-11 LAB — BAYER DCA HB A1C WAIVED: HB A1C (BAYER DCA - WAIVED): 6 % (ref <7.0)

## 2019-11-11 NOTE — Progress Notes (Signed)
Subjective: CC: sweating PCP: Janora Norlander, DO Theodore Gonzalez is a 64 y.o. male presenting to clinic today for:  1. Sweating Patient reports a greater than 1 year history of intermittent sweating.  It can happen throughout the day but often will also occur at nighttime.  He denies this being associated with any activities.  He will sweat in his face, neck and chest.  Denies any unplanned weight loss, change in appetite, hematochezia or melena, tremor, change in bowel habits.  No chest pain, shortness of breath, hemoptysis.  No recent travel or known exposure to those infected with TB.  He does note that his mother said that his father passed away from tuberculosis greater than 30 years ago.  Patient is a former smoker with a 5-pack-year history.  No recent change in medications.  Has not skipped any medicines.  Denies any known family history of lymphoma, leukemia or thyroid disorders.  2.  Erectile dysfunction Patient reports several history of difficulty achieving an erection.  He has been treated with pills before as well as "injections".  He was seeing a urologist many years ago in [place].  He thinks this was through [facility].  They discussed the potential for surgery and he would like to know more about this.  His medical history is significant for pelvic injury/trauma.  ROS: Per HPI  No Known Allergies Past Medical History:  Diagnosis Date  . Hypertension   . Tobacco abuse     Current Outpatient Medications:  .  acetaminophen (TYLENOL) 500 MG tablet, Take 2 tablets (1,000 mg total) by mouth every 6 (six) hours as needed. Alternate tylenol and ibuprofen around the clock for the first 3-4 days after surgery, then take as needed., Disp: 100 tablet, Rfl: 0 .  atorvastatin (LIPITOR) 40 MG tablet, Take 40 mg by mouth daily.  , Disp: , Rfl:  .  Chlorphen-PE-Acetaminophen 4-10-325 MG TABS, Take 1 tablet by mouth every 6 (six) hours as needed., Disp: 20 tablet, Rfl: 0 .   escitalopram (LEXAPRO) 10 MG tablet, TAKE 1 AND 1/2 TABLETS DAILY BY MOUTH, Disp: 135 tablet, Rfl: 1 .  fluticasone (FLONASE) 50 MCG/ACT nasal spray, Place 2 sprays into both nostrils daily., Disp: 16 g, Rfl: 6 .  gabapentin (NEURONTIN) 300 MG capsule, Take 300 mg by mouth 2 (two) times daily. , Disp: , Rfl:  .  ibuprofen (MOTRIN IB) 200 MG tablet, Take 1-2 tablets (200-400 mg total) by mouth every 6 (six) hours as needed. Alternate tylenol and ibuprofen around the clock for the first 3-4 days, then take as needed, Disp: 100 tablet, Rfl: 0 .  lisinopril (ZESTRIL) 5 MG tablet, TAKE 1 TABLET BY MOUTH EVERY DAY, Disp: 90 tablet, Rfl: 1 .  omeprazole (PRILOSEC) 20 MG capsule, TAKE 1 CAPSULE BY MOUTH EVERY DAY, Disp: 90 capsule, Rfl: 1 .  oxyCODONE (ROXICODONE) 15 MG immediate release tablet, Take 15 mg by mouth every 6 (six) hours as needed for pain., Disp: , Rfl:  .  valACYclovir (VALTREX) 500 MG tablet, TAKE 1 TABLET BY MOUTH TWICE A DAY, Disp: 180 tablet, Rfl: 3 Social History   Socioeconomic History  . Marital status: Married    Spouse name: Not on file  . Number of children: Not on file  . Years of education: Not on file  . Highest education level: Not on file  Occupational History  . Not on file  Tobacco Use  . Smoking status: Former Smoker    Packs/day: 0.25  Years: 20.00    Pack years: 5.00    Types: Cigarettes    Start date: 09/02/1968    Quit date: 09/25/1987    Years since quitting: 32.1  . Smokeless tobacco: Never Used  Substance and Sexual Activity  . Alcohol use: No  . Drug use: No  . Sexual activity: Not on file  Other Topics Concern  . Not on file  Social History Narrative   Originally from [place]. Came to the [place] prior to 1987.   Lives with his wife.   Adult children from a previous marriage live independently in [place].   Social Determinants of Health   Financial Resource Strain:   . Difficulty of Paying Living Expenses: Not on file  Food Insecurity:   .  Worried About Charity fundraiser in the Last Year: Not on file  . Ran Out of Food in the Last Year: Not on file  Transportation Needs:   . Lack of Transportation (Medical): Not on file  . Lack of Transportation (Non-Medical): Not on file  Physical Activity:   . Days of Exercise per Week: Not on file  . Minutes of Exercise per Session: Not on file  Stress:   . Feeling of Stress : Not on file  Social Connections:   . Frequency of Communication with Friends and Family: Not on file  . Frequency of Social Gatherings with Friends and Family: Not on file  . Attends Religious Services: Not on file  . Active Member of Clubs or Organizations: Not on file  . Attends Archivist Meetings: Not on file  . Marital Status: Not on file  Intimate Partner Violence:   . Fear of Current or Ex-Partner: Not on file  . Emotionally Abused: Not on file  . Physically Abused: Not on file  . Sexually Abused: Not on file   No family history on file.  Objective: Office vital signs reviewed. BP 121/72   Pulse 80   Temp 98.4 F (36.9 C) (Temporal)   Ht '5\' 6"'  (1.676 m)   Wt 217 lb (98.4 kg)   SpO2 96%   BMI 35.02 kg/m   Physical Examination:  General: Awake, alert, well nourished, No acute distress HEENT: Normal    Neck: No masses palpated. No lymphadenopathy; no thyromegaly or thyroid nodules.  No lymphadenopathy Cardio: regular rate and rhythm, S1S2 heard, no murmurs appreciated Pulm: clear to auscultation bilaterally, no wheezes, rhonchi or rales; normal work of breathing on room air MSK: normal gait and normal station Skin: dry; intact; no rashes or lesions; normal temperature Neuro: No tremor.  Assessment/ Plan: 64 y.o. male   1. Sweating increase Uncertain etiology.  He started Lexapro within the last couple of years.  This may be the etiology as increased sweating has been linked to use of that medication.  We will rule out metabolic etiology.  Rule out tuberculosis given history of  father having passed away from TB.  We discussed the various etiologies of increased sweating including malignancy, thyroid disorders, diabetes and infection.  Hyperhidrosis was also discussed.  Will contact with results once available. - TSH - CMP14+EGFR - CBC with Differential - DG Chest 2 View; Future - QuantiFERON-TB Gold Plus - Bayer DCA Hb A1c Waived - HIV antibody (with reflex) - Hepatitis C antibody  2. Mixed hyperlipidemia - Lipid Panel  3. Screening for HIV (human immunodeficiency virus) - HIV antibody (with reflex)  4. Encounter for hepatitis C screening test for low risk patient - Hepatitis  C antibody  5. Erectile dysfunction due to diseases classified elsewhere Likely secondary to pelvic trauma.  However, is on SSRI as well. - Ambulatory referral to Urology   No orders of the defined types were placed in this encounter.  No orders of the defined types were placed in this encounter.    Janora Norlander, DO [place] [phone]

## 2019-11-11 NOTE — Patient Instructions (Addendum)
You had labs performed today.  You will be contacted with the results of the labs once they are available, usually in the next 3 business days for routine lab work.  If you have an active my chart account, they will be released to your MyChart.  If you prefer to have these labs released to you via telephone, please let us know.  If you had a pap smear or biopsy performed, expect to be contacted in about 7-10 days.   Hyperhidrosis Hyperhidrosis is a condition in which the body sweats a lot more than normal (excessively). Sweating is a necessary function for a human body. It is normal to sweat when you are hot, physically active, or anxious. However, hyperhidrosis is sweating to an excessive degree. Although the condition is not a serious one, it can make you feel embarrassed. There are two kinds of hyperhidrosis:  Primary hyperhidrosis. The sweating usually localizes in one part of your body, such as your underarms, or in a few areas, such as your feet, face, underarms, and hands. This is the more common kind of hyperhidrosis.  Secondary hyperhidrosis. This type usually affects your entire body. What are the causes? The cause of this condition depends on the kind of hyperhidrosis that you have.  Primary hyperhidrosis may be caused by sweat glands that are more active than normal.  Secondary hyperhidrosis may be caused by an underlying condition or by taking certain medicines, such as antidepressants or diabetes medicines. Possible conditions that may cause secondary hyperhidrosis include: ? Diabetes. ? Gout. ? Anxiety. ? Obesity. ? Menopause. ? Overactive thyroid (hyperthyroidism). ? Tumors. ? Frostbite. ? Certain types of cancers. ? Alcoholism. ? Injury to your nervous system. ? Stroke. ? Parkinson's disease. What increases the risk? You are more likely to develop primary hyperhidrosis if you have a family history of the condition. What are the signs or symptoms? Symptoms of this  condition include:  Feeling like you are sweating constantly, even while you are not being active.  Having skin that peels or gets paler or softer in the areas where you sweat the most.  Being able to see sweat on your skin. Other symptoms depend on the kind of hyperhidrosis that you have.  Symptoms of primary hyperhidrosis may include: ? Sweating in the same location on both sides of your body. ? Sweating only during the day and not while you are sleeping. ? Sweating in specific areas, such as your underarms, palms, feet, and face.  Symptoms of secondary hyperhidrosis may include: ? Sweating all over your body. ? Sweating even while you sleep. How is this diagnosed? This condition may be diagnosed by:  Medical history.  Physical exam. You may also have other tests, including:  Tests to measure the amount of sweat you produce and to show the areas where you sweat the most. These tests may involve: ? Using color-changing chemicals to show patterns of sweating on the skin. ? Weighing paper that has been applied to the skin. This will show the amount of sweat that your body produces. ? Measuring the amount of water that evaporates from the skin. ? Using infrared technology to show patterns of sweating on the skin.  Tests to check for other conditions that may be causing excess sweating. This may include blood, urine, or imaging tests. How is this treated? Treatment for this condition depends on the kind of hyperhidrosis that you have and the areas of your body that are affected. Your health care provider will also  treat any underlying conditions. Treatment may include:  Medicines, such as: ? Antiperspirants. These are medicines that stop sweat. ? Injectable medicines. These may include small injections of botulinum toxin. ? Oral medicines. These are taken by mouth to treat underlying conditions and other symptoms.  A procedure to: ? Temporarily turn off the sweat glands in  your hands and feet (iontophoresis). ? Remove your sweat glands. ? Cut or destroy the nerves so that they do not send a signal to the sweat glands (sympathectomy). Follow these instructions at home: Lifestyle   Limit or avoid foods or beverages that may increase your risk of sweating, such as: ? Spicy food. ? Caffeine. ? Alcohol. ? Foods that contain monosodium glutamate (MSG).  If your feet sweat: ? Wear sandals when possible. ? Do not wear cotton socks. Wear socks that remove or wick moisture from your feet. ? Wear leather shoes. ? Avoid wearing the same pair of shoes for two days in a row.  Try placing sweat pads under your clothes to prevent underarm sweat from showing.  Keep a journal of your sweat symptoms and when they occur. This may help you identify things that trigger your sweating. General instructions  Take over-the-counter and prescription medicines only as told by your health care provider.  Use antiperspirants as told by your health care provider.  Consider joining a hyperhidrosis support group.  Keep all follow-up visits as told by your health care provider. This is important. Contact a health care provider if:  You have new symptoms.  Your symptoms get worse. Summary  Hyperhidrosis is a condition in which the body sweats a lot more than normal (excessively).  With primary hyperhidrosis, the sweating usually localizes in one part of your body, such as your underarms, or in a few areas, such as your feet, face, underarms, and hands. It is caused by overactive sweat glands in the affected area.  With secondary hyperhidrosis, the sweating affects your entire body. This is caused by an underlying condition.  Treatment for this condition depends on the kind of hyperhidrosis that you have and the parts of your body that are affected. This information is not intended to replace advice given to you by your health care provider. Make sure you discuss any questions  you have with your health care provider. Document Revised: 07/14/2019 Document Reviewed: 09/13/2017 Elsevier Patient Education  2020 Elsevier Inc.   Hiperhidrosis Hyperhidrosis La hiperhidrosis es una afeccin que se caracteriza porque el cuerpo transpira mucho ms de lo normal (de forma excesiva). La transpiracin es una funcin necesaria para el cuerpo [place]. Es normal que [place] persona transpire cuando tiene Airline pilot, hace actividad fsica o est ansiosa. Sin embargo, la hiperhidrosis es la transpiracin a Tour manager. Aunque esta afeccin no es grave, puede hacer que una persona se sienta avergonzada. Hay dos tipos de hiperhidrosis:  Hiperhidrosis primaria. Por lo general, la transpiracin se localiza en una parte del cuerpo como las [place], o bien en algunas reas [employer], la cara, las axilas y [place]. Este es el tipo de hiperhidrosis ms comn.  Hiperhidrosis secundaria. Por lo general, este tipo afecta todo el cuerpo. Cules son las causas? La causa de esta afeccin depende del tipo de hiperhidrosis que usted tenga.  La hiperhidrosis primaria puede deberse a que las glndulas sudorparas son ms activas de lo normal.  La hiperhidrosis secundaria puede deberse a una afeccin subyacente o a recibir ciertos medicamentos, como antidepresivos o medicamentos para la diabetes. Las [employer]  afecciones que pueden causar hiperhidrosis secundaria incluyen: ? Diabetes. ? Gota. ? Ansiedad. ? Obesidad. ? Menopausia. ? Hiperactividad de la glndula tiroidea (hipertiroidismo). ? Tumores. ? Congelacin. ? Ciertos tipos de cncer. ? Alcoholismo. ? Lesiones en el sistema nervioso. ? Accidente cerebrovascular. ? La enfermedad de Parkinson. Qu incrementa el riesgo? Tiene ms probabilidades de desarrollar hiperhidrosis primaria si tiene antecedentes familiares de la afeccin. Cules son los signos o los sntomas? Los sntomas de esta afeccin [employer] siguientes:  Sensacin  de transpirar permanentemente, incluso mientras no est [place].  Piel que se descama, se pone ms plida o ms fina en las zonas de mayor transpiracin.  Ser capaz de ver la transpiracin en la piel. Otros sntomas dependen del tipo de hiperhidrosis que usted tenga.  Los sntomas de hiperhidrosis primaria pueden incluir lo siguiente: ? Lutricia Horsfall por los mismos lugares a ambos lados del cuerpo. ? Advertising account planner solo [employer] y no mientras est durmiendo. ? Advertising account planner en reas especficas, como las [place], las palmas de [place], los pies y Dance movement psychotherapist.  Los sntomas de hiperhidrosis secundaria pueden incluir lo siguiente: ? Transpirar todo el cuerpo. ? Transpirar incluso mientras duerme. Cmo se diagnostica? Este trastorno puede diagnosticarse mediante:  Antecedentes mdicos.  Un examen fsico. Tambin pueden hacerle otras pruebas, incluidas las siguientes:  Estudios para medir la cantidad de transpiracin que usted produce y para Scientist, clinical (histocompatibility and immunogenetics) las reas en las que transpira ms. Estos estudios pueden implicar: ? Usar sustancias qumicas que cambian de color para mostrar los patrones de transpiracin en la piel. ? Papel de pesar que se ha aplicado en la piel. Esto mostrar la cantidad de transpiracin que produce el cuerpo. ? Medir la cantidad de agua que se evapora de la piel. ? Usar tecnologa infrarroja para mostrar los patrones de transpiracin en la piel.  Realizar estudios para Warehouse manager afecciones que puedan estar causando el exceso de transpiracin. Estos pueden incluir anlisis de [place], [place] o estudios de diagnstico por imgenes. Cmo se trata? El tratamiento para esta afeccin depende del tipo de hiperhidrosis que usted tenga y de las reas del cuerpo afectadas. Su mdico tambin tratar cualquier otra afeccin subyacente. El tratamiento puede incluir lo siguiente:  Medicamentos, por ejemplo: ? Antitranspirantes. Estos son medicamentos que Music therapist. ? Medicamentos inyectables. Estos pueden incluir pequeas inyecciones de toxina botulnica. ? Medicamentos por va oral. Se toman por boca para tratar afecciones subyacentes y otros sntomas.  Un procedimiento para: ? Clinical cytogeneticist las glndulas sudorparas de las manos y los pies (iontoforesis). ? Eliminar las glndulas sudorparas. ? Cortar o destruir los nervios de modo que no enven una seal a las glndulas sudorparas (simpatectoma). Siga estas indicaciones en su casa: Estilo de vida   Limite o evite los alimentos o las bebidas que puedan aumentar el riesgo de transpirar, [place] siguientes: ? Comidas condimentadas. ? Cafena. ? Alcohol. ? Alimentos que contengan glutamato monosdico (GMS).  Si le transpiran los pies: ? Use sandalias cuando sea posible. ? No use medias de algodn. Use medias que eliminen o absorban la humedad de [employer]. ? Use zapatos de cuero. ? No use el mismo par de [employer] seguidos.  Trate de colocar almohadillas absorbentes de transpiracin debajo de la ropa para evitar que se vea la transpiracin.  Mantenga un registro de los sntomas de transpiracin y de cundo ocurren. Esto puede ayudarlo a identificar los factores que desencadenan la transpiracin. Instrucciones generales  [employer] de venta libre y [place]  recetados solamente como se lo haya indicado el mdico.  Use los antitranspirantes como se lo haya indicado su mdico.  Considere la posibilidad de unirse a un grupo de apoyo para la hiperhidrosis.  Concurra a todas las visitas de control como se lo haya indicado el mdico. Esto es importante. Comunquese con un mdico si:  Aparecen nuevos sntomas.  Los sntomas empeoran. Resumen  La hiperhidrosis es una afeccin que se caracteriza porque el cuerpo transpira mucho ms de lo normal (de forma excesiva).  En el caso de la hiperhidrosis primaria, la transpiracin generalmente se localiza en una  parte del cuerpo, como las [place], o bien en algunas reas [employer], la cara, las axilas y [place]. Es causada por las glndulas sudorparas en el rea afectada.  En el caso de la hiperhidrosis secundaria, la transpiracin afecta a todo el cuerpo. Esto se debe a una afeccin subyacente.  El tratamiento para esta afeccin depende del tipo de hiperhidrosis que usted tenga y de las partes del cuerpo afectadas. Esta informacin no tiene Marine scientist el consejo del mdico. Asegrese de hacerle al mdico cualquier pregunta que tenga. Document Revised: 11/04/2017 Document Reviewed: 11/04/2017 Elsevier Patient Education  [place].

## 2019-11-13 ENCOUNTER — Telehealth: Payer: Self-pay | Admitting: Family Medicine

## 2019-11-13 NOTE — Telephone Encounter (Signed)
Lmtcb on labs refer to lab

## 2019-11-14 LAB — QUANTIFERON-TB GOLD PLUS
QuantiFERON Mitogen Value: >10 IU/mL
QuantiFERON Nil Value: 0.03 IU/mL
QuantiFERON TB1 Ag Value: 0.03 IU/mL
QuantiFERON TB2 Ag Value: 0.04 IU/mL
QuantiFERON-TB Gold Plus: NEGATIVE

## 2019-11-14 LAB — CBC WITH DIFFERENTIAL/PLATELET
Basophils Absolute: 0 10*3/uL (ref 0.0–0.2)
Basos: 1 %
EOS (ABSOLUTE): 0.2 10*3/uL (ref 0.0–0.4)
Eos: 2 %
Hematocrit: 42.5 % (ref 37.5–51.0)
Hemoglobin: 15.1 g/dL (ref 13.0–17.7)
Immature Grans (Abs): 0 10*3/uL (ref 0.0–0.1)
Immature Granulocytes: 0 %
Lymphocytes Absolute: 2 10*3/uL (ref 0.7–3.1)
Lymphs: 27 %
MCH: 32.3 pg (ref 26.6–33.0)
MCHC: 35.5 g/dL (ref 31.5–35.7)
MCV: 91 fL (ref 79–97)
Monocytes Absolute: 0.6 10*3/uL (ref 0.1–0.9)
Monocytes: 8 %
Neutrophils Absolute: 4.6 10*3/uL (ref 1.4–7.0)
Neutrophils: 62 %
Platelets: 273 10*3/uL (ref 150–450)
RBC: 4.68 x10E6/uL (ref 4.14–5.80)
RDW: 13.7 % (ref 11.6–15.4)
WBC: 7.3 10*3/uL (ref 3.4–10.8)

## 2019-11-14 LAB — CMP14+EGFR
ALT: 23 IU/L (ref 0–44)
AST: 26 IU/L (ref 0–40)
Albumin/Globulin Ratio: 2 (ref 1.2–2.2)
Albumin: 4.3 g/dL (ref 3.8–4.8)
Alkaline Phosphatase: 98 IU/L (ref 39–117)
BUN/Creatinine Ratio: 16 (ref 10–24)
BUN: 13 mg/dL (ref 8–27)
Bilirubin Total: 0.4 mg/dL (ref 0.0–1.2)
CO2: 21 mmol/L (ref 20–29)
Calcium: 9 mg/dL (ref 8.6–10.2)
Chloride: 100 mmol/L (ref 96–106)
Creatinine, Ser: 0.79 mg/dL (ref 0.76–1.27)
GFR calc Af Amer: 110 mL/min/{1.73_m2} (ref >59)
GFR calc non Af Amer: 95 mL/min/{1.73_m2} (ref >59)
Globulin, Total: 2.2 g/dL (ref 1.5–4.5)
Glucose: 112 mg/dL — ABNORMAL HIGH (ref 65–99)
Potassium: 4.6 mmol/L (ref 3.5–5.2)
Sodium: 136 mmol/L (ref 134–144)
Total Protein: 6.5 g/dL (ref 6.0–8.5)

## 2019-11-14 LAB — LIPID PANEL
Chol/HDL Ratio: 3.9 ratio (ref 0.0–5.0)
Cholesterol, Total: 145 mg/dL (ref 100–199)
HDL: 37 mg/dL — ABNORMAL LOW (ref >39)
LDL Chol Calc (NIH): 74 mg/dL (ref 0–99)
Triglycerides: 206 mg/dL — ABNORMAL HIGH (ref 0–149)
VLDL Cholesterol Cal: 34 mg/dL (ref 5–40)

## 2019-11-14 LAB — HIV ANTIBODY (ROUTINE TESTING W REFLEX): HIV Screen 4th Generation wRfx: NONREACTIVE

## 2019-11-14 LAB — HEPATITIS C ANTIBODY: Hep C Virus Ab: <0.1 s/co ratio (ref 0.0–0.9)

## 2019-11-14 LAB — TSH: TSH: 1.19 u[IU]/mL (ref 0.450–4.500)

## 2019-11-16 ENCOUNTER — Telehealth: Payer: Self-pay | Admitting: Family Medicine

## 2019-11-16 NOTE — Telephone Encounter (Signed)
Patient aware and verbalizes understanding.  States that at work today around [employer] he felt like he was going to pass out but he did not pass out.  States that he sat down for 15 mins and when he felt better he went to check his bp at work.  States that when he checked his bp at 3:30pm it was 171/90.  Patient waited 5 mins and re checked and his bp was 150/81.  Patient states that he feels fine now.  This has happened to patient around 8 months ago and he states all the test came back fine.  Advised that patient should get evaluated.  Patient states that he would get evaluated tomorrow if he could see Dr. Reece Agar only. Advised that her schedule was booked and I could get him in with the acute provider and patient states that he will just wait until Dr. Reece Agar has an opening.  Please advise

## 2019-11-16 NOTE — Telephone Encounter (Signed)
What symptoms do you have? Sweating no matter what he does.   How long have you been sick? One year  Have you been seen for this problem? Last Thursday with Nadine Counts  If your provider decides to give you a prescription, which pharmacy would you like for it to be sent to[facility] [place]   Patient informed that this information will be sent to the clinical staff for review and that they should receive a follow up call.

## 2019-11-16 NOTE — Telephone Encounter (Signed)
I agree with getting checked out sooner than later.  His BP is certainly drastically different than that of our last OV.  If it remains elevated or if he has recurrence of symptoms, he is to seek immediate medical attention.  Otherwise, please put him on with same day tomorrow.  If he refuses, please put him in a 30 min slot on Friday with me.  I would like EKG done.

## 2019-11-16 NOTE — Telephone Encounter (Signed)
We ran a battalion of test to evaluate for possible underlying disorder.  We discussed the possible diagnosis of hyperhidrosis and a handout was provided to him.  I would recommend use of over-the-counter antiperspirant to help with sweating.  At this point, there is no metabolic etiology of his excessive sweating.

## 2019-11-16 NOTE — Telephone Encounter (Signed)
lmtcb

## 2019-11-18 NOTE — Telephone Encounter (Signed)
CALLED PATIENT, LEFT MESSAGE TO RETURN CALL 

## 2019-11-20 NOTE — Telephone Encounter (Signed)
Lmtcb.

## 2019-11-20 NOTE — Telephone Encounter (Signed)
FYI this patient has been called multiple times will not pick up the phone or return calls.

## 2019-12-14 ENCOUNTER — Telehealth: Payer: Self-pay | Admitting: Family Medicine

## 2019-12-14 MED ORDER — VALACYCLOVIR HCL 500 MG PO TABS: 500.0000 mg | ORAL_TABLET | Freq: Two times a day (BID) | ORAL | 1 refills | Status: DC

## 2019-12-14 NOTE — Telephone Encounter (Signed)
Fine with me to fill early but insurance may or may not cover

## 2019-12-14 NOTE — Telephone Encounter (Signed)
Left message to call back  

## 2019-12-14 NOTE — Telephone Encounter (Signed)
Pt lost his valacyclovir, he does have refills on file at the pharmacy ok to fill early?

## 2019-12-14 NOTE — Telephone Encounter (Signed)
Aware. 

## 2020-01-12 ENCOUNTER — Telehealth: Payer: Self-pay | Admitting: Family Medicine

## 2020-01-12 MED ORDER — VALACYCLOVIR HCL 500 MG PO TABS: 500.0000 mg | ORAL_TABLET | Freq: Two times a day (BID) | ORAL | 1 refills | Status: DC

## 2020-01-12 NOTE — Telephone Encounter (Signed)
Rx sent 

## 2020-01-12 NOTE — Telephone Encounter (Signed)
°  Prescription Request  01/12/2020  What is the name of the medication or equipment? valACYclovir (VALTREX) 500 MG tablet  Have you contacted your pharmacy to request a refill? (if applicable) no  [facility]   Patient notified that their request is being sent to the clinical staff for review and that they should receive a response within 2 business days.

## 2020-01-13 ENCOUNTER — Telehealth: Payer: Self-pay | Admitting: Family Medicine

## 2020-01-13 NOTE — Telephone Encounter (Signed)
[facility], it was sent in yesterday for #[id number] with a refill

## 2020-03-02 ENCOUNTER — Other Ambulatory Visit: Payer: Self-pay | Admitting: Family Medicine

## 2020-03-20 ENCOUNTER — Other Ambulatory Visit: Payer: Self-pay | Admitting: Family Medicine

## 2020-03-24 ENCOUNTER — Other Ambulatory Visit: Payer: Self-pay | Admitting: Family Medicine

## 2020-04-15 ENCOUNTER — Other Ambulatory Visit: Payer: Self-pay | Admitting: Family Medicine

## 2020-05-08 ENCOUNTER — Other Ambulatory Visit: Payer: Self-pay | Admitting: Family Medicine

## 2020-05-10 ENCOUNTER — Ambulatory Visit: Payer: 59 | Admitting: Family Medicine

## 2020-05-11 ENCOUNTER — Encounter: Payer: Self-pay | Admitting: Family Medicine

## 2020-06-14 ENCOUNTER — Other Ambulatory Visit: Payer: Self-pay | Admitting: Family Medicine

## 2020-06-16 ENCOUNTER — Encounter: Payer: Self-pay | Admitting: Nurse Practitioner

## 2020-06-16 ENCOUNTER — Ambulatory Visit (INDEPENDENT_AMBULATORY_CARE_PROVIDER_SITE_OTHER): Payer: 59 | Admitting: Nurse Practitioner

## 2020-06-16 ENCOUNTER — Other Ambulatory Visit: Payer: Self-pay

## 2020-06-16 VITALS — BP 119/66 | HR 61 | Temp 98.9°F | Resp 20 | Ht 66.0 in | Wt 207.0 lb

## 2020-06-16 DIAGNOSIS — R42 Dizziness and giddiness: Secondary | ICD-10-CM | POA: Diagnosis not present

## 2020-06-16 NOTE — Progress Notes (Signed)
Subjective:    Patient ID: Theodore Gonzalez, male    DOB: October 13, 1955, 64 y.o.   MRN: [id number]   Chief Complaint: Dizziness   HPI Yesterday morning pt was walking at work and felt dizzy with some blurry vision. Report infrequent episodes of same in the past. No LOC, HA, or lightheadedness. Pt sat down and rested until symptoms resolved. Pt states he had ate breakfast and drink plenty of water throughout the day. Pt reports taking usual morning medication, denies any new medications. Denies n/v.   * had cardiolaogy work up earlier in year and was cleared.  Review of Systems  Constitutional: Negative.   HENT: Negative.   Eyes: Negative.   Respiratory: Negative.   Cardiovascular: Negative.   Gastrointestinal: Negative.   Endocrine: Negative.   Genitourinary: Negative.   Musculoskeletal: Negative.   Skin: Negative.   Allergic/Immunologic: Negative.   Neurological: Positive for dizziness.  Hematological: Negative.   Psychiatric/Behavioral: Negative.   All other systems reviewed and are negative.      Objective:   Physical Exam Constitutional:      Appearance: Normal appearance. He is well-developed and well-groomed.  HENT:     Head: Normocephalic and atraumatic.     Jaw: There is normal jaw occlusion.     Right Ear: External ear normal.     Left Ear: External ear normal.     Nose: Nose normal.     Mouth/Throat:     Mouth: Mucous membranes are moist.     Pharynx: Oropharynx is clear.  Eyes:     Extraocular Movements: Extraocular movements intact.     Conjunctiva/sclera: Conjunctivae normal.     Pupils: Pupils are equal, round, and reactive to light.  Cardiovascular:     Rate and Rhythm: Normal rate and regular rhythm.  Pulmonary:     Effort: Pulmonary effort is normal.     Breath sounds: Normal breath sounds.  Abdominal:     General: Bowel sounds are normal.  Musculoskeletal:        General: Normal range of motion.     Cervical back: Full passive range of motion  without pain and normal range of motion.  Skin:    General: Skin is warm and dry.     Capillary Refill: Capillary refill takes less than 2 seconds.  Neurological:     General: No focal deficit present.     Mental Status: He is alert and oriented to person, place, and time. Mental status is at baseline.     GCS: GCS eye subscore is 4. GCS verbal subscore is 5. GCS motor subscore is 6.     Cranial Nerves: Cranial nerves are intact.     Sensory: Sensation is intact.     Motor: Motor function is intact.     Coordination: Coordination is intact.     Gait: Gait is intact.  Psychiatric:        Mood and Affect: Mood normal.        Behavior: Behavior normal. Behavior is cooperative.        Thought Content: Thought content normal.        Judgment: Judgment normal.    BP 119/66   Pulse 61   Temp 98.9 F (37.2 C) (Temporal)   Resp 20   Ht 5' 6" (1.676 m)   Wt 207 lb (93.9 kg)   BMI 33.41 kg/m     EKG- Sinus bradycardia-Mary-Margaret Hassell Done, FNP  Assessment & Plan:  [name] in today  with chief complaint of Dizziness   1. Vertigo EKG- sinus bardycardia Labs pending Keep diary of episodes with details. - EKG 12-Lead - CBC with Differential/Platelet - CMP14+EGFR - Thyroid Panel With TSH    The above assessment and management plan was discussed with the patient. The patient verbalized understanding of and has agreed to the management plan. Patient is aware to call the clinic if symptoms persist or worsen. Patient is aware when to return to the clinic for a follow-up visit. Patient educated on when it is appropriate to go to the emergency department.   Mary-Margaret Hassell Done, FNP

## 2020-06-16 NOTE — Patient Instructions (Signed)

## 2020-06-17 LAB — CBC WITH DIFFERENTIAL/PLATELET
Basophils Absolute: 0.1 10*3/uL (ref 0.0–0.2)
Basos: 1 %
EOS (ABSOLUTE): 0.2 10*3/uL (ref 0.0–0.4)
Eos: 2 %
Hematocrit: 40.7 % (ref 37.5–51.0)
Hemoglobin: 14.7 g/dL (ref 13.0–17.7)
Immature Grans (Abs): 0 10*3/uL (ref 0.0–0.1)
Immature Granulocytes: 0 %
Lymphocytes Absolute: 3.1 10*3/uL (ref 0.7–3.1)
Lymphs: 40 %
MCH: 33.1 pg — ABNORMAL HIGH (ref 26.6–33.0)
MCHC: 36.1 g/dL — ABNORMAL HIGH (ref 31.5–35.7)
MCV: 92 fL (ref 79–97)
Monocytes Absolute: 0.7 10*3/uL (ref 0.1–0.9)
Monocytes: 9 %
Neutrophils Absolute: 3.7 10*3/uL (ref 1.4–7.0)
Neutrophils: 48 %
Platelets: 226 10*3/uL (ref 150–450)
RBC: 4.44 x10E6/uL (ref 4.14–5.80)
RDW: 14 % (ref 11.6–15.4)
WBC: 7.7 10*3/uL (ref 3.4–10.8)

## 2020-06-17 LAB — CMP14+EGFR
ALT: 15 IU/L (ref 0–44)
AST: 20 IU/L (ref 0–40)
Albumin/Globulin Ratio: 2 (ref 1.2–2.2)
Albumin: 4.2 g/dL (ref 3.8–4.8)
Alkaline Phosphatase: 76 IU/L (ref 44–121)
BUN/Creatinine Ratio: 13 (ref 10–24)
BUN: 10 mg/dL (ref 8–27)
Bilirubin Total: 0.3 mg/dL (ref 0.0–1.2)
CO2: 26 mmol/L (ref 20–29)
Calcium: 8.9 mg/dL (ref 8.6–10.2)
Chloride: 104 mmol/L (ref 96–106)
Creatinine, Ser: 0.78 mg/dL (ref 0.76–1.27)
GFR calc Af Amer: 110 mL/min/{1.73_m2} (ref >59)
GFR calc non Af Amer: 95 mL/min/{1.73_m2} (ref >59)
Globulin, Total: 2.1 g/dL (ref 1.5–4.5)
Glucose: 101 mg/dL — ABNORMAL HIGH (ref 65–99)
Potassium: 4.3 mmol/L (ref 3.5–5.2)
Sodium: 140 mmol/L (ref 134–144)
Total Protein: 6.3 g/dL (ref 6.0–8.5)

## 2020-06-17 LAB — THYROID PANEL WITH TSH
Free Thyroxine Index: 1.5 (ref 1.2–4.9)
T3 Uptake Ratio: 27 % (ref 24–39)
T4, Total: 5.5 ug/dL (ref 4.5–12.0)
TSH: 0.94 u[IU]/mL (ref 0.450–4.500)

## 2020-06-24 ENCOUNTER — Telehealth: Payer: Self-pay

## 2020-06-24 NOTE — Telephone Encounter (Signed)
Patient notified via voicemail that lab results were normal.

## 2020-07-01 ENCOUNTER — Encounter: Payer: Self-pay | Admitting: Family

## 2020-07-01 ENCOUNTER — Ambulatory Visit (INDEPENDENT_AMBULATORY_CARE_PROVIDER_SITE_OTHER): Payer: 59 | Admitting: Family

## 2020-07-01 DIAGNOSIS — Z20822 Contact with and (suspected) exposure to covid-19: Secondary | ICD-10-CM

## 2020-07-01 NOTE — Progress Notes (Signed)
   Virtual Visit via telephone Note Due to COVID-19 pandemic this visit was conducted virtually. This visit type was conducted due to national recommendations for restrictions regarding the COVID-19 Pandemic (e.g. social distancing, sheltering in place) in an effort to limit this patient's exposure and mitigate transmission in our community. All issues noted in this document were discussed and addressed.  A physical exam was not performed with this format.  I connected with Theodore Gonzalez on 07/01/20 at 1:22 pm  by telephone and verified that I am speaking with the correct person using two identifiers. MAJED PELLEGRIN is currently located at home and no one is currently with him during visit. The provider, Jannifer Rodney, FNP is located in their office at time of visit.  I discussed the limitations, risks, security and privacy concerns of performing an evaluation and management service by telephone and the availability of in person appointments. I also discussed with the patient that there may be a patient responsible charge related to this service. The patient expressed understanding and agreed to proceed.   History and Present Illness:  HPI  Pt calls the office today requesting to be tested for COVID. He had to take his wife to the hospital yesterday because she was "weak and SOB", but was diagnosed with COVID at the hospital. Her symptoms started 06/30/20.   He denies any fever, cough, SOB, or diarrhea.   Review of Systems  All other systems reviewed and are negative.    Observations/Objective: No SOB or distress noted   Assessment and Plan: DRAYK HUMBARGER comes in today with chief complaint of No chief complaint on file.   Diagnosis and orders addressed:  1. Exposure to COVID-19 virus Will come today to be tested Denies any symptoms Stay quarantine until tests returns - Novel Coronavirus, NAA ([facility])    I discussed the assessment and treatment plan with the patient. The patient  was provided an opportunity to ask questions and all were answered. The patient agreed with the plan and demonstrated an understanding of the instructions.   The patient was advised to call back or seek an in-person evaluation if the symptoms worsen or if the condition fails to improve as anticipated.  The above assessment and management plan was discussed with the patient. The patient verbalized understanding of and has agreed to the management plan. Patient is aware to call the clinic if symptoms persist or worsen. Patient is aware when to return to the clinic for a follow-up visit. Patient educated on when it is appropriate to go to the emergency department.   Time call ended:  1:33 pm   I provided 11 minutes of non-face-to-face time during this encounter.    Jannifer Rodney, FNP '

## 2020-07-03 LAB — SARS-COV-2, NAA 2 DAY TAT

## 2020-07-03 LAB — NOVEL CORONAVIRUS, NAA: SARS-CoV-2, NAA: NOT DETECTED

## 2020-07-04 ENCOUNTER — Telehealth: Payer: Self-pay

## 2020-07-04 NOTE — Telephone Encounter (Signed)
Pt needs work note that covid test is neg. Please call when ready

## 2020-07-04 NOTE — Telephone Encounter (Signed)
Letter done- patient aware 

## 2020-07-08 ENCOUNTER — Telehealth: Payer: Self-pay

## 2020-07-08 MED ORDER — VALACYCLOVIR HCL 500 MG PO TABS
500.0000 mg | ORAL_TABLET | Freq: Two times a day (BID) | ORAL | 1 refills | Status: DC
Start: 2020-07-08 — End: 2020-07-11

## 2020-07-08 NOTE — Telephone Encounter (Signed)
  Prescription Request  07/08/2020  What is the name of the medication or equipment? Valcyclovir  Have you contacted your pharmacy to request a refill? (if applicable) Yes  Which [facility]  Pt states that his wife just passed away and since then his mind has been going crazy and he misplaced his Valcyclovir Rx and cant find it anywhere. Pt says he really needs refill on med.  Patient notified that their request is being sent to the clinical staff for review and that they should receive a response within 2 business days.

## 2020-07-08 NOTE — Telephone Encounter (Signed)
Left detailed message rx sent in  

## 2020-07-11 MED ORDER — VALACYCLOVIR HCL 500 MG PO TABS
500.0000 mg | ORAL_TABLET | Freq: Two times a day (BID) | ORAL | 1 refills | Status: DC
Start: 2020-07-11 — End: 2020-11-16

## 2020-07-11 NOTE — Addendum Note (Signed)
Addended by: Julious Payer D on: 07/11/2020 10:29 AM   Modules accepted: Orders

## 2020-07-11 NOTE — Telephone Encounter (Signed)
E-prescribe down. resent 

## 2020-07-15 DIAGNOSIS — Z029 Encounter for administrative examinations, unspecified: Secondary | ICD-10-CM

## 2020-08-02 DIAGNOSIS — Z0289 Encounter for other administrative examinations: Secondary | ICD-10-CM

## 2020-08-15 ENCOUNTER — Other Ambulatory Visit: Payer: Self-pay | Admitting: Family Medicine

## 2020-10-03 ENCOUNTER — Other Ambulatory Visit: Payer: Self-pay | Admitting: Family Medicine

## 2020-10-03 NOTE — Telephone Encounter (Signed)
[place]. NTBS 30 days given 08/15/20 

## 2020-10-31 ENCOUNTER — Other Ambulatory Visit: Payer: Self-pay | Admitting: *Deleted

## 2020-11-01 ENCOUNTER — Telehealth: Payer: Self-pay

## 2020-11-01 ENCOUNTER — Other Ambulatory Visit: Payer: Self-pay

## 2020-11-01 MED ORDER — ESCITALOPRAM OXALATE 10 MG PO TABS
15.0000 mg | ORAL_TABLET | Freq: Every day | ORAL | 0 refills | Status: DC
Start: 2020-11-01 — End: 2020-11-07

## 2020-11-01 NOTE — Telephone Encounter (Signed)
  Prescription Request  11/01/2020  What is the name of the medication or equipment? Pt made an appt on 12-09-20 with Dr Nadine Counts to refill his lexapro. He has one pill left and [place]. He wants to know if he can get a months worth called into pharmacy  Have you contacted your pharmacy to request a refill? (if applicable) yes  [facility]   Patient notified that their request is being sent to the clinical staff for review and that they should receive a response within 2 business days.

## 2020-11-01 NOTE — Telephone Encounter (Signed)
[facility] [place]. Appt for 3/18 was already scheduled and not in a 30 minute slot. Blocked the 4:15pm that afternoon to makeup time.

## 2020-11-01 NOTE — Telephone Encounter (Signed)
Absolutely.  Ok to send in 90 day supply.  Please keep follow up and make sure he is scheduled in an appropriate physical/ slot if he has not been seen in >1 year.

## 2020-11-07 ENCOUNTER — Other Ambulatory Visit: Payer: Self-pay | Admitting: *Deleted

## 2020-11-07 DIAGNOSIS — K219 Gastro-esophageal reflux disease without esophagitis: Secondary | ICD-10-CM

## 2020-11-07 MED ORDER — ATORVASTATIN CALCIUM 40 MG PO TABS: ORAL_TABLET | ORAL | 0 refills | Status: DC

## 2020-11-07 MED ORDER — OMEPRAZOLE 20 MG PO CPDR: DELAYED_RELEASE_CAPSULE | ORAL | 0 refills | Status: DC

## 2020-11-07 MED ORDER — ESCITALOPRAM OXALATE 10 MG PO TABS: 15.0000 mg | ORAL_TABLET | Freq: Every day | ORAL | 0 refills | Status: DC

## 2020-11-07 MED ORDER — LISINOPRIL 5 MG PO TABS: 5.0000 mg | ORAL_TABLET | Freq: Every day | ORAL | 0 refills | Status: DC

## 2020-11-12 ENCOUNTER — Other Ambulatory Visit: Payer: Self-pay | Admitting: Family Medicine

## 2020-11-16 ENCOUNTER — Telehealth: Payer: Self-pay

## 2020-11-16 MED ORDER — VALACYCLOVIR HCL 500 MG PO TABS: 500.0000 mg | ORAL_TABLET | Freq: Two times a day (BID) | ORAL | 0 refills | Status: DC

## 2020-11-16 NOTE — Telephone Encounter (Signed)
LMOVM refill sent to pharmacy. Next OV 12/09/20

## 2020-11-16 NOTE — Telephone Encounter (Signed)
  Prescription Request  11/16/2020  What is the name of the medication or equipment? Valtrex/ pt is going out [place] Saturday  Have you contacted your pharmacy to request a refill? (if applicable) no  [facility] [address]   Patient notified that their request is being sent to the clinical staff for review and that they should receive a response within 2 business days.

## 2020-12-09 ENCOUNTER — Ambulatory Visit (INDEPENDENT_AMBULATORY_CARE_PROVIDER_SITE_OTHER): Payer: 59 | Admitting: Family Medicine

## 2020-12-09 ENCOUNTER — Other Ambulatory Visit: Payer: Self-pay

## 2020-12-09 VITALS — BP 122/78 | HR 100 | Temp 97.5°F | Wt 203.0 lb

## 2020-12-09 DIAGNOSIS — I10 Essential (primary) hypertension: Secondary | ICD-10-CM

## 2020-12-09 DIAGNOSIS — E782 Mixed hyperlipidemia: Secondary | ICD-10-CM

## 2020-12-09 DIAGNOSIS — K219 Gastro-esophageal reflux disease without esophagitis: Secondary | ICD-10-CM

## 2020-12-09 DIAGNOSIS — F3342 Major depressive disorder, recurrent, in full remission: Secondary | ICD-10-CM | POA: Diagnosis not present

## 2020-12-09 DIAGNOSIS — N400 Enlarged prostate without lower urinary tract symptoms: Secondary | ICD-10-CM

## 2020-12-09 DIAGNOSIS — Z1211 Encounter for screening for malignant neoplasm of colon: Secondary | ICD-10-CM

## 2020-12-09 DIAGNOSIS — N529 Male erectile dysfunction, unspecified: Secondary | ICD-10-CM

## 2020-12-09 MED ORDER — OMEPRAZOLE 20 MG PO CPDR: DELAYED_RELEASE_CAPSULE | ORAL | 3 refills | Status: AC

## 2020-12-09 MED ORDER — ESCITALOPRAM OXALATE 10 MG PO TABS: 15.0000 mg | ORAL_TABLET | Freq: Every day | ORAL | 3 refills | Status: DC

## 2020-12-09 MED ORDER — LISINOPRIL 5 MG PO TABS: 5.0000 mg | ORAL_TABLET | Freq: Every day | ORAL | 3 refills | Status: DC

## 2020-12-09 MED ORDER — ATORVASTATIN CALCIUM 40 MG PO TABS: ORAL_TABLET | ORAL | 3 refills | Status: DC

## 2020-12-09 NOTE — Patient Instructions (Signed)
Come early in the morning for labs

## 2020-12-09 NOTE — Progress Notes (Signed)
Subjective: CC: Hypertension, hyperlipidemia PCP: Janora Norlander, DO Theodore Gonzalez is a 65 y.o. male presenting to clinic today for:  1.  Hypertension with hyperlipidemia Patient is compliant lisinopril 5 mg daily, Lipitor 40 mg daily.  Reports of chest pain, shortness of breath or edema.  Overall has been feeling pretty well and recently got back from visiting family in [place].  2.  Erectile dysfunction, BPH Patient is treated with Flomax.  He was seeing a urologist at [facility] but states that he would really like a more thorough evaluation and further explanation of various options.  He would like to reestablish with a neurologist in [place] if possible.  He also thinks he may have recurrent hernia  3.  Colon cancer screening Patient asking for a new referral to his GI doctor in [place].  He denies any rectal bleeding, nausea or vomiting.   ROS: Per HPI  No Known Allergies Past Medical History:  Diagnosis Date  . Hypertension   . Tobacco abuse     Current Outpatient Medications:  .  atorvastatin (LIPITOR) 40 MG tablet, TAKE ONE TABLET (40 MG TOTAL) BY MOUTH DAILY., Disp: 90 tablet, Rfl: 0 .  escitalopram (LEXAPRO) 10 MG tablet, Take 1.5 tablets (15 mg total) by mouth daily., Disp: 135 tablet, Rfl: 0 .  gabapentin (NEURONTIN) 300 MG capsule, Take 300 mg by mouth 2 (two) times daily. , Disp: , Rfl:  .  lisinopril (ZESTRIL) 5 MG tablet, TAKE 1 TABLET BY MOUTH EVERY DAY, Disp: 90 tablet, Rfl: 0 .  naloxone (NARCAN) nasal spray 4 mg/0.1 mL, Place into the nose., Disp: , Rfl:  .  omeprazole (PRILOSEC) 20 MG capsule, TAKE 1 CAPSULE BY MOUTH EVERY DAY, Disp: 90 capsule, Rfl: 0 .  oxyCODONE (ROXICODONE) 15 MG immediate release tablet, Take 15 mg by mouth every 6 (six) hours as needed for pain., Disp: , Rfl:  .  tamsulosin (FLOMAX) 0.4 MG CAPS capsule, Take 0.4 mg by mouth daily., Disp: , Rfl:  .  valACYclovir (VALTREX) 500 MG tablet, Take 1 tablet (500 mg total)  by mouth 2 (two) times daily., Disp: 180 tablet, Rfl: 0 Social History   Socioeconomic History  . Marital status: Married    Spouse name: Not on file  . Number of children: Not on file  . Years of education: Not on file  . Highest education level: Not on file  Occupational History  . Not on file  Tobacco Use  . Smoking status: Former Smoker    Packs/day: 0.25    Years: 20.00    Pack years: 5.00    Types: Cigarettes    Start date: 09/02/1968    Quit date: 09/25/1987    Years since quitting: 33.2  . Smokeless tobacco: Never Used  Vaping Use  . Vaping Use: Never used  Substance and Sexual Activity  . Alcohol use: No  . Drug use: No  . Sexual activity: Not on file  Other Topics Concern  . Not on file  Social History Narrative   Originally from [place]. Came to the [place] prior to 1987.   Lives with his wife.   Adult children from a previous marriage live independently in [place].   Social Determinants of Health   Financial Resource Strain: Not on file  Food Insecurity: Not on file  Transportation Needs: Not on file  Physical Activity: Not on file  Stress: Not on file  Social Connections: Not on file  Intimate Partner Violence: Not on file  No family history on file.  Objective: Office vital signs reviewed. BP 122/78   Pulse 100   Temp (!) 97.5 F (36.4 C)   Wt 203 lb (92.1 kg)   SpO2 95%   BMI 32.77 kg/m   Physical Examination:  General: Awake, alert, well nourished, No acute distress HEENT: Normal; sclera white.  Moist mucous membranes.  No carotid bruits Cardio: regular rate and rhythm, S1S2 heard, no murmurs appreciated Pulm: clear to auscultation bilaterally, no wheezes, rhonchi or rales; normal work of breathing on room air GI: soft, non-tender, non-distended, bowel sounds present x4, no hepatomegaly, no splenomegaly, no masses GU: He does have a fullness in the inguinal canal bilaterally Extremities: warm, well perfused, No edema, cyanosis or clubbing; +2  pulses bilaterally Psych: Mood stable, speech normal, affect appropriate Depression screen [facility] 2/9 12/09/2020 06/16/2020 11/11/2019  Decreased Interest 0 0 0  Down, Depressed, Hopeless 0 0 0  PHQ - 2 Score 0 0 0  Altered sleeping 0 - 0  Tired, decreased energy 0 - 0  Change in appetite 0 - 0  Feeling bad or failure about yourself  0 - 0  Trouble concentrating 0 - 0  Moving slowly or fidgety/restless 0 - 0  Suicidal thoughts 0 - 0  PHQ-9 Score 0 - 0    Assessment/ Plan: 65 y.o. male   Mixed hyperlipidemia - Plan: CMP14+EGFR, Lipid panel, TSH, atorvastatin (LIPITOR) 40 MG tablet  Gastroesophageal reflux disease without esophagitis - Plan: omeprazole (PRILOSEC) 20 MG capsule  Recurrent major depressive disorder, in full remission ([place]) - Plan: escitalopram (LEXAPRO) 10 MG tablet  Erectile dysfunction, unspecified erectile dysfunction type - Plan: PSA, Testosterone,Free and Total, Ambulatory referral to Urology  Benign prostatic hyperplasia without lower urinary tract symptoms - Plan: PSA, Testosterone,Free and Total, Ambulatory referral to Urology  Primary hypertension - Plan: lisinopril (ZESTRIL) 5 MG tablet  Colon cancer screening - Plan: Ambulatory referral to Gastroenterology  He will come in for fasting lipid panel.  Lipitor has been renewed  GERD is stable.  Prilosec renewed  Depression stable Lexapro renewed  I have referred him to urology in [facility] as per his request for ED and what sounds like BPH  Lisinopril renewed.  Blood pressure under excellent control  Referral back to GI placed for colon cancer screening  No orders of the defined types were placed in this encounter.  No orders of the defined types were placed in this encounter.    Janora Norlander, DO [name] [phone]

## 2020-12-17 ENCOUNTER — Other Ambulatory Visit: Payer: Self-pay | Admitting: Family Medicine

## 2020-12-17 DIAGNOSIS — E782 Mixed hyperlipidemia: Secondary | ICD-10-CM

## 2021-01-16 ENCOUNTER — Other Ambulatory Visit: Payer: Self-pay | Admitting: Family Medicine

## 2021-01-17 ENCOUNTER — Telehealth: Payer: Self-pay

## 2021-01-17 NOTE — Telephone Encounter (Signed)
Lmtcb.

## 2021-01-17 NOTE — Telephone Encounter (Signed)
  Prescription Request  01/17/2021  What is the name of the medication or equipment? valACYclovir (VALTREX) 500 MG tablet  Have you contacted your pharmacy to request a refill? (if applicable) yes but cannot fill till 01/31/2021--pt completely out  [facility] [address]   Patient notified that their request is being sent to the clinical staff for review and that they should receive a response within 2 business days.

## 2021-02-08 ENCOUNTER — Telehealth: Payer: Self-pay

## 2021-02-08 MED ORDER — VALACYCLOVIR HCL 500 MG PO TABS: 500.0000 mg | ORAL_TABLET | Freq: Two times a day (BID) | ORAL | 0 refills | Status: DC

## 2021-02-08 NOTE — Telephone Encounter (Signed)
[facility] [address], I know he has always used this pharmacy. He has several other Rxs ready to be picked up, does not have a refill on his Valtrex, [facility] Pharmacist said that she talked to the patient about this medication as well yesterday and was going to send [place] a refill request too. We will go with this if patient lets the pharmacy or [place] know that he would rather wait on mail order to send it to him we will send it onto [facility]

## 2021-02-08 NOTE — Telephone Encounter (Signed)
Theodore Gonzalez states he has received notification from [facility] that they need [place] to call regarding his valacyclovir prescription because otherwise they will not be able to cover. He does not have any refills.  He would like to speak with a nurse.

## 2021-02-16 ENCOUNTER — Other Ambulatory Visit: Payer: Self-pay | Admitting: Family Medicine

## 2021-02-16 ENCOUNTER — Ambulatory Visit (INDEPENDENT_AMBULATORY_CARE_PROVIDER_SITE_OTHER): Payer: 59 | Admitting: Family

## 2021-02-16 ENCOUNTER — Encounter: Payer: Self-pay | Admitting: Family

## 2021-02-16 DIAGNOSIS — J069 Acute upper respiratory infection, unspecified: Secondary | ICD-10-CM | POA: Diagnosis not present

## 2021-02-16 DIAGNOSIS — R059 Cough, unspecified: Secondary | ICD-10-CM | POA: Diagnosis not present

## 2021-02-16 MED ORDER — PREDNISONE 10 MG (21) PO TBPK: ORAL_TABLET | ORAL | 0 refills | Status: DC

## 2021-02-16 MED ORDER — FLUTICASONE PROPIONATE 50 MCG/ACT NA SUSP: 2.0000 | Freq: Every day | NASAL | 6 refills | Status: AC

## 2021-02-16 NOTE — Patient Instructions (Signed)
Infeccin de las vas respiratorias superiores, en adultos Upper Respiratory Infection, Adult Una infeccin de las vas respiratorias superiores (IVRS) es una infeccin viral comn de la [place], garganta y de las vas respiratorias superiores que conducen el aire a los pulmones. El tipo ms comn de IVRS es el resfro comn. Las IVRS generalmente mejoran solas, sin tratamiento mdico. Cules son las causas? La causa es un virus. Se puede contagiar este virus:  Al aspirar las gotitas que una persona infectada elimina al toser o Engineering geologist.  Al tocar algo que estuvo expuesto al virus (fue contaminado) y luego tocarse la boca, nariz u ojos. Qu incrementa el riesgo? Es ms propenso a Health and safety inspector IVRS si:  Es muy pequeo o de edad muy [place].  Es el otoo o el invierno.  Tiene contacto cercano con otros, como en [place], escuela o centro de atencin mdica.  Fuma.  Tiene una enfermedad cardaca o pulmonar a largo plazo (crnica).  Tiene debilitado el sistema que combate las enfermedades (inmunitario).  Tiene asma o alergias nasales.  Est sufriendo mucho estrs.  Rudi Coco en una rea con mala circulacin de aire.  Tiene un dficit nutricional. Cules son los signos o sntomas? La IVRS suele presentar alguno de los siguientes sntomas:  Secrecin nasal o nariz tapada (congestin).  Estornudos.  Tos.  Dolor de Advertising copywriter.  Dolor de [place].  Fatiga.  Grant Ruts.  Prdida del apetito.  Dolor en la frente, detrs de los ojos y por encima de los pmulos (dolor sinusal).  Dolores musculares.  Enrojecimiento o irritacin de los ojos.  Presin en los odos o la cara. Cmo se diagnostica? Esta afeccin se puede diagnosticar en funcin de los antecedentes mdicos, los sntomas y un examen fsico. El mdico puede usar un hisopo para tomar una muestra de mucosidad de la nariz (hisopado nasal). Esta muestra puede analizarse para determinar qu virus est provocando la  enfermedad. Cmo se trata? Las IVRS generalmente mejoran por s solas en un perodo de entre 7 y [address]. Puede tomar [employer] en su casa para aliviar los sntomas. Los medicamentos no curan las IVRS, Biomedical engineer el mdico puede recomendarle ciertos medicamentos para ayudar a [employer], como por ejemplo:  Medicamentos para la tos de [place].  Antitusivos. Toser es un tipo de defensa contra una infeccin que ayuda a limpiar el sistema respiratorio, de modo que tome estos medicamentos solo segn se lo recomiende el mdico.  Medicamentos para bajar la fiebre. Siga estas indicaciones en casa: Actividad  Descansar todo lo que sea necesario.  Si tiene fiebre, qudese en su casa, es decir, no vaya al trabajo o la escuela, hasta que no tenga fiebre o hasta que el mdico le diga que ya no Programmer, systems. El mdico puede pedirle que use una mscara facial para evitar que disemine la infeccin. Para aliviar los sntomas  [place] grgaras con una mezcla de agua y sal 3 o 4veces al da, o cuando sea necesario. Para preparar la mezcla de agua y sal, disuelva totalmente de media a 1cucharadita de sal en 1taza de agua tibia.  Use un humidificador de aire fro para agregar humedad al aire. Esto puede ayudarlo a que respire mejor. Comida y bebida  Beber suficiente lquido como para Pharmacologist la orina de color amarillo plido.  Tome sopas y caldos transparentes.   Instrucciones generales  [place] los medicamentos de venta libre y los recetados solamente como te lo haya indicado el mdico. Estos incluyen medicamentos para el resfro, para Personal assistant  fiebre y antitusivos.  No consuma ningn producto que contenga nicotina o tabaco, como cigarrillos y Administrator, Civil Service. Si necesita ayuda para dejar de fumar, consulte al mdico.  Aljese del humo de otros fumadores.  Mantngase al da con todas las vacunas, incluso la vacuna anual (una vez al ao) contra la gripe.  Concurra a todas las visitas de  [address] se lo haya indicado el mdico. Esto es importante.   Cmo evitar contagiar la infeccin a otros  Las IVRS se transmiten de Neomia Dear persona a Theodoro Clock (son contagiosas). Para evitar que la infeccin se propague, tome las siguientes medidas: ? Lvese las manos frecuentemente con agua y [place]. Use desinfectante para manos si no dispone de [place] y [place]. ? Evite tocarse la boca, la cara, los ojos o la [place]. ? Tosa o estornude en un pauelo de papel o en su manga o codo en lugar de hacerlo en la mano o en el aire.   Comunquese con un mdico si:  Empeora en lugar de mejorar.  Tiene fiebre o escalofros.  Tiene mucosidad marrn o roja.  Tiene una secrecin amarilla o marrn de la Clinical cytogeneticist.  Le duele la cara, especialmente al inclinarse hacia adelante.  Tiene los ganglios del cuello hinchados.  Siente dolor al tragar.  Tiene zonas blancas en la parte de atrs de la garganta. Solicite ayuda de inmediato si:  La falta de aire empeora.  Tiene sntomas intensos o persistentes de: ? Dolor de [place]. ? Dolor de odo. ? Dolor sinusal. ? Dolor de pecho.  Tiene enfermedad pulmonar crnica junto con cualquiera de estos sntomas: ? Sibilancias. ? Tos prolongada. ? Tos con Montez Hageman. ? Cambio en la mucosidad habitual.  Presenta rigidez en el cuello.  Tiene cambios en: ? La visin. ? La audicin. ? Razonar. ? El [place] de nimo. Resumen  Una infeccin de las vas respiratorias superiores (IVRS) es una infeccin comn de la nariz, garganta y de las vas respiratorias superiores que conducen el aire a los pulmones.  La causa es un virus.  Las IVRS generalmente mejoran por s solas en un perodo de entre 7 y [address].  Los medicamentos no curan las IVRS, pero el mdico puede recomendarle ciertos medicamentos para ayudar a [employer]. Esta informacin no tiene Theme park manager el consejo del mdico. Asegrese de hacerle al mdico cualquier pregunta que tenga. Document  Revised: 07/14/2020 Document Reviewed: 05/31/2020 Elsevier Patient Education  2021 [employer].

## 2021-02-16 NOTE — Progress Notes (Signed)
Subjective:    Patient ID: Theodore Gonzalez, male    DOB: Jul 23, 1956, 65 y.o.   MRN: [id number]  Chief Complaint  Patient presents with  . Fatigue  . Nasal Congestion    Cough This is a new problem. The current episode started in the past 7 days. The problem has been gradually worsening. The problem occurs every few minutes. The cough is non-productive. Associated symptoms include a fever, myalgias, nasal congestion, postnasal drip, rhinorrhea and a sore throat. Pertinent negatives include no chills, ear congestion, ear pain, headaches, shortness of breath or wheezing. He has tried rest and OTC cough suppressant for the symptoms. The treatment provided mild relief.      Review of Systems  Constitutional: Positive for fever. Negative for chills.  HENT: Positive for postnasal drip, rhinorrhea and sore throat. Negative for ear pain.   Respiratory: Positive for cough. Negative for shortness of breath and wheezing.   Musculoskeletal: Positive for myalgias.  Neurological: Negative for headaches.  All other systems reviewed and are negative.      Objective:   Physical Exam Vitals reviewed.  Constitutional:      General: He is not in acute distress.    Appearance: He is well-developed.  HENT:     Head: Normocephalic.     Right Ear: Tympanic membrane normal.     Left Ear: Tympanic membrane normal.     Nose:     Right Turbinates: Enlarged.     Left Turbinates: Enlarged.  Eyes:     General:        Right eye: No discharge.        Left eye: No discharge.     Pupils: Pupils are equal, round, and reactive to light.  Neck:     Thyroid: No thyromegaly.  Cardiovascular:     Rate and Rhythm: Normal rate and regular rhythm.     Heart sounds: Normal heart sounds. No murmur heard.   Pulmonary:     Effort: Pulmonary effort is normal. No respiratory distress.     Breath sounds: Normal breath sounds. No wheezing.  Abdominal:     General: Bowel sounds are normal. There is no distension.      Palpations: Abdomen is soft.     Tenderness: There is no abdominal tenderness.  Musculoskeletal:        General: No tenderness. Normal range of motion.     Cervical back: Normal range of motion and neck supple.  Skin:    General: Skin is warm and dry.     Findings: No erythema or rash.  Neurological:     Mental Status: He is alert and oriented to person, place, and time.     Cranial Nerves: No cranial nerve deficit.     Deep Tendon Reflexes: Reflexes are normal and symmetric.  Psychiatric:        Behavior: Behavior normal.        Thought Content: Thought content normal.        Judgment: Judgment normal.       BP (!) 154/85   Pulse 68   Temp 98.1 F (36.7 C) (Temporal)   Ht 5\' 6"  (1.676 m)   Wt 203 lb (92.1 kg)   SpO2 97%   BMI 32.77 kg/m      Assessment & Plan:  Theodore Gonzalez comes in today with chief complaint of Fatigue and Nasal Congestion   Diagnosis and orders addressed:  1. Cough - Novel Coronavirus, NAA ([facility])  2. Viral  URI - Take meds as prescribed - Use a cool mist humidifier  -Use saline nose sprays frequently -Force fluids -For any cough or congestion  Use plain Mucinex- regular strength or max strength is fine -For fever or aces or pains- take tylenol or ibuprofen. -Throat lozenges if help RTO if symptoms worsen or do not improve  - predniSONE (STERAPRED UNI-PAK 21 TAB) 10 MG (21) TBPK tablet; Use as directed  Dispense: 21 tablet; Refill: 0 - fluticasone (FLONASE) 50 MCG/ACT nasal spray; Place 2 sprays into both nostrils daily.  Dispense: 16 g; Refill: 6   Jannifer Rodney, FNP

## 2021-02-17 LAB — SARS-COV-2, NAA 2 DAY TAT

## 2021-02-17 LAB — NOVEL CORONAVIRUS, NAA: SARS-CoV-2, NAA: NOT DETECTED

## 2021-02-21 ENCOUNTER — Telehealth: Payer: Self-pay | Admitting: Family Medicine

## 2021-02-21 NOTE — Telephone Encounter (Signed)
Patient aware and verbalized understanding. Patient aware test negative

## 2021-02-27 ENCOUNTER — Other Ambulatory Visit: Payer: Self-pay

## 2021-02-27 ENCOUNTER — Emergency Department (HOSPITAL_COMMUNITY): Payer: Worker's Compensation

## 2021-02-27 ENCOUNTER — Emergency Department (HOSPITAL_COMMUNITY)
Admission: EM | Admit: 2021-02-27 | Discharge: 2021-02-27 | Disposition: A | Discharge status: Home / Self Care | Payer: Worker's Compensation | Attending: Emergency Medicine | Admitting: Emergency Medicine

## 2021-02-27 DIAGNOSIS — Z87891 Personal history of nicotine dependence: Secondary | ICD-10-CM | POA: Diagnosis not present

## 2021-02-27 DIAGNOSIS — M25522 Pain in left elbow: Secondary | ICD-10-CM | POA: Diagnosis not present

## 2021-02-27 DIAGNOSIS — S20212A Contusion of left front wall of thorax, initial encounter: Secondary | ICD-10-CM | POA: Diagnosis not present

## 2021-02-27 DIAGNOSIS — Y990 Civilian activity done for income or pay: Secondary | ICD-10-CM | POA: Insufficient documentation

## 2021-02-27 DIAGNOSIS — S0083XA Contusion of other part of head, initial encounter: Secondary | ICD-10-CM | POA: Insufficient documentation

## 2021-02-27 DIAGNOSIS — W11XXXA Fall on and from ladder, initial encounter: Secondary | ICD-10-CM | POA: Insufficient documentation

## 2021-02-27 DIAGNOSIS — S80212A Abrasion, left knee, initial encounter: Secondary | ICD-10-CM | POA: Insufficient documentation

## 2021-02-27 DIAGNOSIS — S80812A Abrasion, left lower leg, initial encounter: Secondary | ICD-10-CM | POA: Insufficient documentation

## 2021-02-27 DIAGNOSIS — I10 Essential (primary) hypertension: Secondary | ICD-10-CM | POA: Insufficient documentation

## 2021-02-27 DIAGNOSIS — W19XXXA Unspecified fall, initial encounter: Secondary | ICD-10-CM

## 2021-02-27 DIAGNOSIS — S52501A Unspecified fracture of the lower end of right radius, initial encounter for closed fracture: Secondary | ICD-10-CM

## 2021-02-27 DIAGNOSIS — Z79899 Other long term (current) drug therapy: Secondary | ICD-10-CM | POA: Diagnosis not present

## 2021-02-27 DIAGNOSIS — S52591A Other fractures of lower end of right radius, initial encounter for closed fracture: Secondary | ICD-10-CM | POA: Diagnosis not present

## 2021-02-27 DIAGNOSIS — T07XXXA Unspecified multiple injuries, initial encounter: Secondary | ICD-10-CM

## 2021-02-27 DIAGNOSIS — S6991XA Unspecified injury of right wrist, hand and finger(s), initial encounter: Secondary | ICD-10-CM | POA: Diagnosis present

## 2021-02-27 LAB — CBC WITH DIFFERENTIAL/PLATELET
Abs Immature Granulocytes: 0.06 10*3/uL (ref 0.00–0.07)
Basophils Absolute: 0 10*3/uL (ref 0.0–0.1)
Basophils Relative: 0 %
Eosinophils Absolute: 0.1 10*3/uL (ref 0.0–0.5)
Eosinophils Relative: 0 %
HCT: 43.2 % (ref 39.0–52.0)
Hemoglobin: 14.7 g/dL (ref 13.0–17.0)
Immature Granulocytes: 1 %
Lymphocytes Relative: 13 %
Lymphs Abs: 1.7 10*3/uL (ref 0.7–4.0)
MCH: 31.7 pg (ref 26.0–34.0)
MCHC: 34 g/dL (ref 30.0–36.0)
MCV: 93.1 fL (ref 80.0–100.0)
Monocytes Absolute: 0.9 10*3/uL (ref 0.1–1.0)
Monocytes Relative: 7 %
Neutro Abs: 10.4 10*3/uL — ABNORMAL HIGH (ref 1.7–7.7)
Neutrophils Relative %: 79 %
Platelets: 245 10*3/uL (ref 150–400)
RBC: 4.64 MIL/uL (ref 4.22–5.81)
RDW: 13.5 % (ref 11.5–15.5)
WBC: 13.2 10*3/uL — ABNORMAL HIGH (ref 4.0–10.5)
nRBC: 0 % (ref 0.0–0.2)

## 2021-02-27 LAB — COMPREHENSIVE METABOLIC PANEL
ALT: 23 U/L (ref 0–44)
AST: 31 U/L (ref 15–41)
Albumin: 3.8 g/dL (ref 3.5–5.0)
Alkaline Phosphatase: 64 U/L (ref 38–126)
Anion gap: 10 (ref 5–15)
BUN: 13 mg/dL (ref 8–23)
CO2: 24 mmol/L (ref 22–32)
Calcium: 8.8 mg/dL — ABNORMAL LOW (ref 8.9–10.3)
Chloride: 102 mmol/L (ref 98–111)
Creatinine, Ser: 0.79 mg/dL (ref 0.61–1.24)
GFR, Estimated: >60 mL/min (ref >60)
Glucose, Bld: 109 mg/dL — ABNORMAL HIGH (ref 70–99)
Potassium: 3.8 mmol/L (ref 3.5–5.1)
Sodium: 136 mmol/L (ref 135–145)
Total Bilirubin: 0.9 mg/dL (ref 0.3–1.2)
Total Protein: 6.7 g/dL (ref 6.5–8.1)

## 2021-02-27 MED ORDER — OXYCODONE HCL 5 MG PO TABS
15.0000 mg | ORAL_TABLET | Freq: Once | ORAL | Status: AC
  Administered 2021-02-27: 15 mg via ORAL
  Filled 2021-02-27: qty 3

## 2021-02-27 NOTE — ED Notes (Signed)
Ortho tech notified of splint order 

## 2021-02-27 NOTE — ED Provider Notes (Signed)
CT head shows no acute pathology.  Patient is neurovascular intact after placement of the splint.  Advising outpatient follow-up with orthopedic surgery within the week, advised immediate return for worsening pain fevers or any additional concerns.    Cheryll Cockayne, MD 02/27/21 [phone]

## 2021-02-27 NOTE — Discharge Instructions (Signed)
All the other x-rays were OK.  No broken bones in the ribs, left knee, lower leg or left elbow or shoulder.  CAT scan of the head was normal.  Do not lift anything with the right hand.  You cannot return to work until cleared by the specialist.  Make sure you elevate your hand and continue to take your pain medications.

## 2021-02-27 NOTE — ED Provider Notes (Signed)
[facility] EMERGENCY DEPARTMENT Provider Note   CSN: [id number] Arrival date & time: 02/27/21  1225     History Chief Complaint  Patient presents with  . Fall    Theodore Gonzalez is a 65 y.o. male.  Patient is a 65 year old male with a history of hypertension, tobacco use, chronic pain after an injury that resulted in pelvic fracture and multiple surgeries on oxycodone 15 mg immediate release every 6 hours who presents A fall.  Patient reports that he was standing on top of a 10 foot ladder when he was trying to pull on a pipe and lost his balance.  He initially grabbed onto the pipe and then fell approximately 10 feet to the ground.  He denies any loss of consciousness but did hit his head and left side of his body.  He continues to have left-sided knee and tib-fib pain, pain in his left elbow and pain in the right wrist.  He takes no anticoagulation, denies any neck pain.  He initially was having some chest pain but reports it is now resolved.  He denies any shortness of breath.  He has no abdominal pain or back pain.  He does report it is time to take one of his normal pain medications and he did take 1 this morning.  The history is provided by the patient and the EMS personnel.  Fall This is a new problem. The current episode started 1 to 2 hours ago. The problem occurs constantly. The problem has not changed since onset.      Past Medical History:  Diagnosis Date  . Hypertension   . Tobacco abuse     Patient Active Problem List   Diagnosis Date Noted  . Gastroesophageal reflux disease 07/02/2016  . Recurrent major depressive disorder, in full remission (HCC) 07/02/2016  . Chronic pain syndrome 06/29/2016  . Obstructive sleep apnea syndrome, moderate 07/21/2015  . Hyperlipidemia 12/07/2013  . Inguinal pain of both sides 02/20/2012  . ED (erectile dysfunction) 02/20/2012  . HTN (hypertension) 09/24/2011  . Clostridium difficile colitis 09/22/2011  . Pelvic  fracture (HCC) 09/10/2011  . Dislocation of right shoulder joint 09/10/2011  . Dislocation of right ankle joint 09/10/2011  . Closed right ankle fracture 09/10/2011    Past Surgical History:  Procedure Laterality Date  . EXTERNAL FIXATION PELVIS  09/10/2011   Procedure: EXTERNAL FIXATION PELVIS;  Surgeon: Vania Rea Supple;  Location: [facility] OR;  Service: Orthopedics;  Laterality: N/A;  . INGUINAL HERNIA REPAIR Bilateral 06/12/2019   Procedure: OPEN RIGHT INGUINAL HERNIA REPAIR;  Surgeon: Berna Bue, MD;  Location: [facility] ORS;  Service: General;  Laterality: Bilateral;  . IRRIGATION AND DEBRIDEMENT KNEE  09/10/2011   Procedure: IRRIGATION AND DEBRIDEMENT KNEE;  Surgeon: Vania Rea Supple;  Location: [facility] OR;  Service: Orthopedics;  Laterality: Left;  . ORIF ANKLE FRACTURE  09/14/2011   Procedure: OPEN REDUCTION INTERNAL FIXATION (ORIF) ANKLE FRACTURE;  Surgeon: Budd Palmer;  Location: [facility] OR;  Service: Orthopedics;  Laterality: Right;  . ORIF PELVIC FRACTURE  09/14/2011   Procedure: OPEN REDUCTION INTERNAL FIXATION (ORIF) PELVIC FRACTURE;  Surgeon: Budd Palmer;  Location: [facility] OR;  Service: Orthopedics;  Laterality: N/A;  . SHOULDER ARTHROSCOPY  03/25/2012   Procedure: ARTHROSCOPY SHOULDER;  Surgeon: Mable Paris, MD;  Location: [facility];  Service: Orthopedics;  Laterality: Right;  Right Shoulder Arthroscopy with Capsular Release and Manipulation with Debridement of Irreparable Rotator Cuff Tear  No family history on file.  Social History   Tobacco Use  . Smoking status: Former Smoker    Packs/day: 0.25    Years: 20.00    Pack years: 5.00    Types: Cigarettes    Start date: 09/02/1968    Quit date: 09/25/1987    Years since quitting: 33.4  . Smokeless tobacco: Never Used  Vaping Use  . Vaping Use: Never used  Substance Use Topics  . Alcohol use: No  . Drug use: No    Home Medications Prior to Admission medications   Medication Sig Start Date End  Date Taking? Authorizing Provider  atorvastatin (LIPITOR) 40 MG tablet TAKE ONE TABLET (40 MG TOTAL) BY MOUTH DAILY. 12/09/20   Raliegh Ip, DO  escitalopram (LEXAPRO) 10 MG tablet Take 1.5 tablets (15 mg total) by mouth daily. 12/09/20   Raliegh Ip, DO  fluticasone (FLONASE) 50 MCG/ACT nasal spray Place 2 sprays into both nostrils daily. 02/16/21   Jannifer Rodney A, FNP  gabapentin (NEURONTIN) 300 MG capsule Take 300 mg by mouth 2 (two) times daily.  04/02/16   [provider]  lisinopril (ZESTRIL) 5 MG tablet Take 1 tablet (5 mg total) by mouth daily. 12/09/20   Raliegh Ip, DO  naloxone [facility]) nasal spray 4 mg/0.1 mL Place into the nose. 02/03/20   [provider]  omeprazole (PRILOSEC) 20 MG capsule TAKE 1 CAPSULE BY MOUTH EVERY DAY 12/09/20   Delynn Flavin M, DO  oxyCODONE (ROXICODONE) 15 MG immediate release tablet Take 15 mg by mouth every 6 (six) hours as needed for pain.    [provider]  predniSONE (STERAPRED UNI-PAK 21 TAB) 10 MG (21) TBPK tablet Use as directed 02/16/21   Jannifer Rodney A, FNP  tamsulosin (FLOMAX) 0.4 MG CAPS capsule Take 0.4 mg by mouth daily. 09/24/20   [provider]  valACYclovir (VALTREX) 500 MG tablet Take 1 tablet (500 mg total) by mouth 2 (two) times daily. 02/08/21   Raliegh Ip, DO    Allergies    Patient has no known allergies.  Review of Systems   Review of Systems  All other systems reviewed and are negative.   Physical Exam Updated Vital Signs BP 112/73   Pulse 69   Temp (!) 97.5 F (36.4 C) (Oral)   Resp 12   SpO2 95%   Physical Exam Vitals and nursing note reviewed.  Constitutional:      General: He is not in acute distress.    Appearance: He is well-developed.  HENT:     Head: Normocephalic.      Mouth/Throat:     Mouth: Mucous membranes are moist.  Eyes:     Conjunctiva/sclera: Conjunctivae normal.     Pupils: Pupils are equal, round, and reactive to light.   Cardiovascular:     Rate and Rhythm: Normal rate and regular rhythm.     Heart sounds: No murmur heard.   Pulmonary:     Effort: Pulmonary effort is normal. No tachypnea or respiratory distress.     Breath sounds: Normal breath sounds. No wheezing or rales.  Chest:     Chest wall: No tenderness.    Abdominal:     General: There is no distension.     Palpations: Abdomen is soft.     Tenderness: There is no abdominal tenderness. There is no guarding or rebound.  Musculoskeletal:        General: Tenderness present.     Right shoulder:  Normal.     Left shoulder: Normal.     Left elbow: Normal range of motion. Tenderness present in lateral epicondyle.     Right wrist: Swelling, tenderness, bony tenderness and snuff box tenderness present. No deformity. Decreased range of motion. Normal pulse.     Left wrist: Normal.     Cervical back: Full passive range of motion without pain, normal range of motion and neck supple. No tenderness. No spinous process tenderness or muscular tenderness.     Left knee: Tenderness present over the lateral joint line.     Left lower leg: Tenderness and bony tenderness present. No swelling.     Left ankle: Normal.       Legs:     Comments: Sensation in all 5 fingers on the right hand is intact and handgrip is normal.  Pain is worse with flexion and extension of the right wrist.  Contusion and superficial abrasion noted to the lateral left knee and anterior distal tib-fib area which are both tender with palpation.  Range of motion is not affected at the knee.  Skin:    General: Skin is warm and dry.     Findings: No erythema or rash.  Neurological:     General: No focal deficit present.     Mental Status: He is alert and oriented to person, place, and time. Mental status is at baseline.  Psychiatric:        Mood and Affect: Mood normal.        Behavior: Behavior normal.     ED Results / Procedures / Treatments   Labs (all labs ordered are listed, but  only abnormal results are displayed) Labs Reviewed  CBC WITH DIFFERENTIAL/PLATELET - Abnormal; Notable for the following components:      Result Value   WBC 13.2 (*)    Neutro Abs 10.4 (*)    All other components within normal limits  COMPREHENSIVE METABOLIC PANEL - Abnormal; Notable for the following components:   Glucose, Bld 109 (*)    Calcium 8.8 (*)    All other components within normal limits    EKG EKG Interpretation  Date/Time:  Monday February 27 2021 12:27:40 EDT Ventricular Rate:  73 PR Interval:  162 QRS Duration: 115 QT Interval:  399 QTC Calculation: 440 R Axis:   47 Text Interpretation: Sinus rhythm Incomplete right bundle branch block ST elevation suggests acute pericarditis No significant change since last tracing Confirmed by Gwyneth Sprout ([id number]) on 02/27/2021 1:57:41 PM  ED ECG REPORT   Date: 02/27/2021  Rate: 73  Rhythm: normal sinus rhythm  QRS Axis: normal  Intervals: normal  ST/T Wave abnormalities: normal  Conduction Disutrbances:incomplete right bundle branch block  Narrative Interpretation:   Old EKG Reviewed: unchanged  I have personally reviewed the EKG tracing and agree with the computerized printout as noted.  Radiology DG Elbow Complete Left  Result Date: 02/27/2021 CLINICAL DATA:  Pain following fall EXAM: LEFT ELBOW - COMPLETE 3+ VIEW COMPARISON:  None. FINDINGS: Frontal, lateral, and bilateral oblique views were obtained. There is no appreciable fracture or dislocation. No joint effusion. No appreciable joint space narrowing or erosion. IMPRESSION: No fracture or dislocation. No appreciable joint space narrowing or erosion. Electronically Signed   By: Bretta Bang III M.D.   On: 02/27/2021 14:21   DG Wrist Complete Right  Result Date: 02/27/2021 CLINICAL DATA:  Pain following fall EXAM: RIGHT WRIST - COMPLETE 3+ VIEW COMPARISON:  None. FINDINGS: Frontal, oblique, and lateral views were  obtained. There is a comminuted fracture of the  distal radial metaphysis with impaction at the fracture site. No other fractures are appreciable. No dislocation. Joint spaces appear unremarkable. IMPRESSION: Comminuted fracture distal radial metaphysis with impaction fracture site. Overall alignment near anatomic. No other fracture. No dislocation. No significant joint space narrowing. Electronically Signed   By: Bretta Bang III M.D.   On: 02/27/2021 14:20   DG Tibia/Fibula Left  Result Date: 02/27/2021 CLINICAL DATA:  Pain following fall EXAM: LEFT TIBIA AND FIBULA - 2 VIEW COMPARISON:  None. FINDINGS: Frontal and lateral views obtained. No acute fracture or dislocation. Smooth periosteal reaction along mid fibula potentially may represent residua of old trauma. There is calcification medial to the medial distal femoral condyle, likely due to calcific tendinosis. No erosions. IMPRESSION: No fracture or dislocation. Suspect calcific tendinosis medial to the medial distal femoral condyle. Benign periosteal reaction mid fibula potentially may represent residua of old trauma. Electronically Signed   By: Bretta Bang III M.D.   On: 02/27/2021 14:24   CT Head Wo Contrast  Result Date: 02/27/2021 CLINICAL DATA:  Larey Seat from a ladder this morning. EXAM: CT HEAD WITHOUT CONTRAST TECHNIQUE: Contiguous axial images were obtained from the base of the skull through the vertex without intravenous contrast. COMPARISON:  09/10/2011 FINDINGS: Brain: The ventricles are normal in size and configuration. No extra-axial fluid collections are identified. The gray-white differentiation is maintained. No CT findings for acute hemispheric infarction or intracranial hemorrhage. No mass lesions. The brainstem and cerebellum are normal. Vascular: No hyperdense vessels or obvious aneurysm. Skull: No acute skull fracture.  No bone lesion. Sinuses/Orbits: The paranasal sinuses and mastoid air cells are clear. The globes are intact. Other: No scalp lesions, laceration or  hematoma. IMPRESSION: Normal head CT. Electronically Signed   By: Rudie Meyer M.D.   On: 02/27/2021 15:32   DG Pelvis Portable  Result Date: 02/27/2021 CLINICAL DATA:  Pain following fall EXAM: PORTABLE PELVIS 1-2 VIEWS COMPARISON:  September 26, 2011 FINDINGS: There is screw fixation through each sacroiliac joint. There is screw and plate fixation through the pubic symphysis region with bony remodeling in this area. No acute fracture or dislocation. There is mild symmetric narrowing of each hip joint. There is partial ankylosis of the sacroiliac joint. IMPRESSION: Postoperative changes. Ankylosis and remodeling in portions of the pubic symphysis. Screws through each sacroiliac joint with partial ankylosis in these areas. No acute fracture or dislocation. Mild symmetric narrowing of each hip joint noted. Electronically Signed   By: Bretta Bang III M.D.   On: 02/27/2021 14:19   DG Chest Port 1 View  Result Date: 02/27/2021 CLINICAL DATA:  Pain following fall EXAM: PORTABLE CHEST 1 VIEW COMPARISON:  November 11, 2019 FINDINGS: Note that a small portion of the inferolateral right base is not visualized. Visualized lungs are clear. Heart size is upper normal with pulmonary vascular normal. No adenopathy. No pneumothorax. There is degenerative change in the thoracic spine. No fracture appreciable. IMPRESSION: Small portion of the inferolateral right base not visualized. Visualized lungs clear. Heart upper normal in size. No pneumothorax. Electronically Signed   By: Bretta Bang III M.D.   On: 02/27/2021 14:18   DG Knee 3 Views Left  Result Date: 02/27/2021 CLINICAL DATA:  Pain following fall EXAM: LEFT KNEE - 3 VIEW COMPARISON:  None. FINDINGS: Frontal, lateral, and oblique views obtained. No fracture or dislocation. No joint effusion. There is moderate joint space narrowing medially. There is mild spurring in all compartments.  There is calcification medial to the distal medial femoral condyle. No  erosion. IMPRESSION: No evident fracture, dislocation, or effusion. Osteoarthritic change, most notably in the patellofemoral joint. Calcification medial to the medial distal femoral condyle is likely due to calcific tendinosis. Electronically Signed   By: Bretta BangWilliam  Woodruff III M.D.   On: 02/27/2021 14:33    Procedures Procedures   Medications Ordered in ED Medications  oxyCODONE (Oxy IR/ROXICODONE) immediate release tablet 15 mg (has no administration in time range)    ED Course  I have reviewed the triage vital signs and the nursing notes.  Pertinent labs & imaging results that were available during my care of the patient were reviewed by me and considered in my medical decision making (see chart for details).    MDM Rules/Calculators/A&P                          Patient is a 65 year old male who had a fall 10 feet today when he lost his balance on the top of a ladder.  Patient is having pain mostly on the left side of the body including the left elbow, left knee and tib-fib area.  Initially he was having some chest pain but that resolved after pain medication by EMS.  He does have small contusion to the left side of his chest.  Breath sounds are equal bilaterally and no shortness of breath with low suspicion for pneumothorax.  He has some abrasions to the left forehead but denies any neck pain and has full range of motion with no discomfort.  He has no back pain.  He appears neurologically and vascularly intact.  He takes no anticoagulation and had no LOC.  We will scan his head and do imaging of the left elbow, right wrist, left knee and tib-fib area which is where patient is having pain.  He does have a significant history of severe pelvic fracture from a work-related injury resulting in multiple surgeries and is having some mild left pelvic tenderness but is not sure if that is chronic or new.  We will also get a plain film of the left hip but low suspicion for hip fracture at this time.   Patient does take 15 mg of oxycodone every 6 hours and reports he is due for a dose.  He will be given a home dose of his medication.  Imaging is pending.  Patient overall is well-appearing.  2:48 PM All imaging is neg for acute issues except for right wrist with comminuted fracture of the distal radial metaphysis with impacation fracture site and near anatomic alignment.  Pt placed in splint and have him follow up with Dr. Merry Proudollier Pace.  Head CT neg.  MDM Number of Diagnoses or Management Options   Amount and/or Complexity of Data Reviewed Clinical lab tests: ordered and reviewed Tests in the radiology section of CPT: reviewed and ordered Tests in the medicine section of CPT: ordered and reviewed Decide to obtain previous medical records or to obtain history from someone other than the patient: yes Obtain history from someone other than the patient: yes Review and summarize past medical records: yes Discuss the patient with other providers: yes Independent visualization of images, tracings, or specimens: yes  Risk of Complications, Morbidity, and/or Mortality Presenting problems: moderate Diagnostic procedures: moderate Management options: moderate  Patient Progress Patient progress: stable    Final Clinical Impression(s) / ED Diagnoses Final diagnoses:  Fall, initial encounter  Contusion of face,  initial encounter  Multiple contusions  Closed fracture of distal end of right radius, unspecified fracture morphology, initial encounter    Rx / DC Orders ED Discharge Orders    None       Gwyneth Sprout, MD 02/28/21 1149

## 2021-02-27 NOTE — ED Triage Notes (Signed)
BIB EMS for fall from a 36ft ladder. No LOC, did not hit head, not on blood thinner. Pt fell on his left side. C/o left thigh, left elbow and right wrist pain. Hx of broken pelvis and broken right ankle.

## 2021-02-28 ENCOUNTER — Other Ambulatory Visit: Payer: Self-pay | Admitting: Plastic Surgery

## 2021-02-28 DIAGNOSIS — S52501A Unspecified fracture of the lower end of right radius, initial encounter for closed fracture: Secondary | ICD-10-CM

## 2021-03-03 ENCOUNTER — Ambulatory Visit (HOSPITAL_COMMUNITY)
Admission: RE | Admit: 2021-03-03 | Discharge: 2021-03-03 | Disposition: A | Discharge status: Home / Self Care | Payer: 59 | Source: Ambulatory Visit | Attending: Plastic Surgery | Admitting: Plastic Surgery

## 2021-03-03 ENCOUNTER — Other Ambulatory Visit: Payer: Self-pay

## 2021-03-03 DIAGNOSIS — S52501A Unspecified fracture of the lower end of right radius, initial encounter for closed fracture: Secondary | ICD-10-CM | POA: Diagnosis not present

## 2021-03-09 ENCOUNTER — Encounter: Payer: Self-pay | Admitting: Plastic Surgery

## 2021-03-09 ENCOUNTER — Other Ambulatory Visit: Payer: Self-pay

## 2021-03-09 ENCOUNTER — Ambulatory Visit (INDEPENDENT_AMBULATORY_CARE_PROVIDER_SITE_OTHER): Payer: 59 | Admitting: Plastic Surgery

## 2021-03-09 VITALS — BP 108/69 | HR 88 | Ht 68.0 in | Wt 200.8 lb

## 2021-03-09 DIAGNOSIS — S52501A Unspecified fracture of the lower end of right radius, initial encounter for closed fracture: Secondary | ICD-10-CM | POA: Diagnosis not present

## 2021-03-09 NOTE — Progress Notes (Signed)
Referring Provider Raliegh Ip, DO [address] [place],  [place] [address]   CC:  Chief Complaint  Patient presents with   Advice Only      Theodore Gonzalez is an 65 y.o. male.  HPI: Patient presents after a fall last week.  He sustained a minimally displaced right distal radius fracture.  There is some comminution but does not appear to have an intra-articular component.  He was splinted and sent to me for follow-up.  He feels like he is doing well from his other injuries.  He reports normal sensation in his fingers.  No Known Allergies  Outpatient Encounter Medications as of 03/09/2021  Medication Sig   atorvastatin (LIPITOR) 40 MG tablet TAKE ONE TABLET (40 MG TOTAL) BY MOUTH DAILY.   escitalopram (LEXAPRO) 10 MG tablet Take 1.5 tablets (15 mg total) by mouth daily.   fluticasone (FLONASE) 50 MCG/ACT nasal spray Place 2 sprays into both nostrils daily.   gabapentin (NEURONTIN) 300 MG capsule Take 300 mg by mouth 2 (two) times daily.    lisinopril (ZESTRIL) 5 MG tablet Take 1 tablet (5 mg total) by mouth daily.   naloxone (NARCAN) nasal spray 4 mg/0.1 mL Place into the nose.   omeprazole (PRILOSEC) 20 MG capsule TAKE 1 CAPSULE BY MOUTH EVERY DAY   oxyCODONE (ROXICODONE) 15 MG immediate release tablet Take 15 mg by mouth every 6 (six) hours as needed for pain.   predniSONE (STERAPRED UNI-PAK 21 TAB) 10 MG (21) TBPK tablet Use as directed   tamsulosin (FLOMAX) 0.4 MG CAPS capsule Take 0.4 mg by mouth daily.   valACYclovir (VALTREX) 500 MG tablet Take 1 tablet (500 mg total) by mouth 2 (two) times daily.   No facility-administered encounter medications on file as of 03/09/2021.     Past Medical History:  Diagnosis Date   Hypertension    Tobacco abuse     Past Surgical History:  Procedure Laterality Date   EXTERNAL FIXATION PELVIS  09/10/2011   Procedure: EXTERNAL FIXATION PELVIS;  Surgeon: Vania Rea Supple;  Location: [facility];  Service: Orthopedics;  Laterality: N/A;    INGUINAL HERNIA REPAIR Bilateral 06/12/2019   Procedure: OPEN RIGHT INGUINAL HERNIA REPAIR;  Surgeon: Berna Bue, MD;  Location: [facility];  Service: General;  Laterality: Bilateral;   IRRIGATION AND DEBRIDEMENT KNEE  09/10/2011   Procedure: IRRIGATION AND DEBRIDEMENT KNEE;  Surgeon: Vania Rea Supple;  Location: [facility];  Service: Orthopedics;  Laterality: Left;   ORIF ANKLE FRACTURE  09/14/2011   Procedure: OPEN REDUCTION INTERNAL FIXATION (ORIF) ANKLE FRACTURE;  Surgeon: Budd Palmer;  Location: [facility];  Service: Orthopedics;  Laterality: Right;   ORIF PELVIC FRACTURE  09/14/2011   Procedure: OPEN REDUCTION INTERNAL FIXATION (ORIF) PELVIC FRACTURE;  Surgeon: Budd Palmer;  Location: [facility];  Service: Orthopedics;  Laterality: N/A;   SHOULDER ARTHROSCOPY  03/25/2012   Procedure: ARTHROSCOPY SHOULDER;  Surgeon: Mable Paris, MD;  Location: [facility];  Service: Orthopedics;  Laterality: Right;  Right Shoulder Arthroscopy with Capsular Release and Manipulation with Debridement of Irreparable Rotator Cuff Tear    No family history on file.  Social History   Social History Narrative   Originally from [place]. Came to the [place] prior to 1987.   Lives with his wife.   Adult children from a previous marriage live independently in [place].     Review of Systems General: Denies fevers, chills, weight loss CV: Denies chest pain, shortness of  breath, palpitations  Physical Exam Vitals with BMI 03/09/2021 02/27/2021 02/27/2021  Height 5\' 8"  - -  Weight 200 lbs 13 oz - -  BMI 30.54 - -  Systolic 108 129  Diastolic 69 84 62  Pulse 88 88 82    General:  No acute distress,  Alert and oriented, Non-Toxic, Normal speech and affect Right hand: Fingers well-perfused with normal capillary refill.  He seems to be able to flex and extend his fingers.  Reports normal sensation in his fingers.  I reviewed and independently interpreted his right wrist x-ray.  I had films of  comparison between the emergency room and a second set that was taken yesterday.  This reveals an unchanged comminuted distal radius fracture with minimal if any displacement.  Assessment/Plan Patient presents with a minimally displaced right distal radius fracture.  I recommended nonoperative treatment.  We discussed the risks and benefits of this strategy.  I will plan to see him again in 3 weeks with a follow-up x-ray.  We will also have him referred for a custom splint but have asked him to keep on at all times.  I do expect this to heal appropriately without any further displacement or need for any surgery.  409 03/09/2021, 12:52 PM

## 2021-03-29 ENCOUNTER — Ambulatory Visit (INDEPENDENT_AMBULATORY_CARE_PROVIDER_SITE_OTHER): Payer: Worker's Compensation | Admitting: Plastic Surgery

## 2021-03-29 ENCOUNTER — Ambulatory Visit (HOSPITAL_COMMUNITY)
Admission: RE | Admit: 2021-03-29 | Discharge: 2021-03-29 | Disposition: A | Discharge status: Home / Self Care | Payer: 59 | Source: Ambulatory Visit | Attending: Plastic Surgery | Admitting: Plastic Surgery

## 2021-03-29 ENCOUNTER — Other Ambulatory Visit: Payer: Self-pay

## 2021-03-29 DIAGNOSIS — S52501A Unspecified fracture of the lower end of right radius, initial encounter for closed fracture: Secondary | ICD-10-CM | POA: Insufficient documentation

## 2021-03-29 NOTE — Progress Notes (Signed)
   Referring Provider Raliegh Ip, DO [address] [place],  [place] [address]   CC:  Chief Complaint  Patient presents with   Follow-up      Theodore Gonzalez is an 65 y.o. male.  HPI: Patient presents about 1 month out from right distal radius fracture.  This was nondisplaced.  We have been treating this nonoperatively.  He has had a splint fitted and feels like things are going well.  He still has some swelling but reports improved range of motion in his fingers and normal sensation.  Review of Systems General: Denies fevers and chills  Physical Exam Vitals with BMI 03/09/2021 02/27/2021 02/27/2021  Height 5\' 8"  - -  Weight 200 lbs 13 oz - -  BMI 30.54 - -  Systolic 108 129  Diastolic 69 84 62  Pulse 88 88 82    General:  No acute distress,  Alert and oriented, Non-Toxic, Normal speech and affect Right hand: Fingers well-perfused normal capillary refill.  He has good flexion extension of his fingers and pronation and supination of his wrist.  Reports normal sensation.  Assessment/Plan Patient is doing well about a month out from right distal radius fracture.  We will plan to get another x-ray today to make sure that this is healing appropriately.  I would anticipate around 6 weeks of immobilization at which point we can likely discard the splint and begin therapy to improve range of motion.  All his questions were answered.  I will plan to see him again in about 3 weeks.  116 03/29/2021, 11:51 AM

## 2021-04-17 ENCOUNTER — Ambulatory Visit (HOSPITAL_COMMUNITY)
Admission: RE | Admit: 2021-04-17 | Discharge: 2021-04-17 | Disposition: A | Discharge status: Home / Self Care | Payer: 59 | Source: Ambulatory Visit | Attending: Plastic Surgery | Admitting: Plastic Surgery

## 2021-04-17 ENCOUNTER — Other Ambulatory Visit: Payer: Self-pay

## 2021-04-17 DIAGNOSIS — S52501A Unspecified fracture of the lower end of right radius, initial encounter for closed fracture: Secondary | ICD-10-CM | POA: Insufficient documentation

## 2021-04-19 ENCOUNTER — Other Ambulatory Visit: Payer: Self-pay

## 2021-04-19 ENCOUNTER — Ambulatory Visit (INDEPENDENT_AMBULATORY_CARE_PROVIDER_SITE_OTHER): Payer: Worker's Compensation | Admitting: Plastic Surgery

## 2021-04-19 DIAGNOSIS — S52501A Unspecified fracture of the lower end of right radius, initial encounter for closed fracture: Secondary | ICD-10-CM

## 2021-04-19 NOTE — Progress Notes (Signed)
Patient presents almost 8 weeks out from right distal radius fracture.  Fracture was minimally displaced and appears to be healing well with nonoperative treatment.  He has been in a splint the entire time.  He feels like things are going well but reports wrist stiffness and some weakness in grip strength.  His MPs have been blocked a little bit by the splint so he cannot make a full tight fist.  X-rays showed improvement in the healing of the fracture and maintained alignment of the bone.  On exam he reports normal sensation in his fingers and good flexion and extension with some restrictions and full flexion.  His wrist itself is appropriately stiff.  We will plan to get him into therapy starting next week to work on strengthening and range of motion.  Regarding his job he can return to work in a limited capacity just using his right hand to assist without doing anything heavy or strenuous with it.  I will plan to see him again in 4 to 6 weeks time.

## 2021-05-11 ENCOUNTER — Telehealth: Payer: Self-pay | Admitting: Family Medicine

## 2021-05-11 ENCOUNTER — Other Ambulatory Visit: Payer: Self-pay

## 2021-05-11 DIAGNOSIS — I10 Essential (primary) hypertension: Secondary | ICD-10-CM

## 2021-05-11 MED ORDER — LISINOPRIL 5 MG PO TABS: 5.0000 mg | ORAL_TABLET | Freq: Every day | ORAL | 3 refills | Status: AC

## 2021-05-11 MED ORDER — VALACYCLOVIR HCL 500 MG PO TABS: 500.0000 mg | ORAL_TABLET | Freq: Two times a day (BID) | ORAL | 0 refills | Status: DC

## 2021-05-30 ENCOUNTER — Ambulatory Visit (HOSPITAL_BASED_OUTPATIENT_CLINIC_OR_DEPARTMENT_OTHER): Payer: 59 | Admitting: Family Medicine

## 2021-06-01 ENCOUNTER — Ambulatory Visit (INDEPENDENT_AMBULATORY_CARE_PROVIDER_SITE_OTHER): Payer: Worker's Compensation | Admitting: Plastic Surgery

## 2021-06-01 ENCOUNTER — Other Ambulatory Visit: Payer: Self-pay

## 2021-06-01 DIAGNOSIS — S52501A Unspecified fracture of the lower end of right radius, initial encounter for closed fracture: Secondary | ICD-10-CM

## 2021-06-01 NOTE — Progress Notes (Signed)
   Referring Provider Raliegh Ip, DO [address] [place],  [place] [address]   CC:  Chief Complaint  Patient presents with   Follow-up      Theodore Gonzalez is an 65 y.o. male.  HPI: Patient presents for follow-up of right distal radius fracture.  We manage this nonoperative.  He feels like he is doing well.  He has very minimal pain.  He has reasonable wrist range of motion and feels like his only residual deficit is a little bit of stiffness with his index finger flexion.  Review of Systems General: Denies fevers and chills  Physical Exam Vitals with BMI 03/09/2021 02/27/2021 02/27/2021  Height 5\' 8"  - -  Weight 200 lbs 13 oz - -  BMI 30.54 - -  Systolic 108 129  Diastolic 69 84 62  Pulse 88 88 82    General:  No acute distress,  Alert and oriented, Non-Toxic, Normal speech and affect Right hand: Fingers well-perfused normal cap refill palp radial pulse.  Sensation is intact throughout.  He has full range of motion of his fingers with the exception of a small flexor deficit of the index finger.  He is ultimately able to get this down to his palm however with some effort.  No wrist tenderness.  Has good wrist range of motion.  Assessment/Plan Patient is doing well after nonoperative treatment of a minimally displaced distal radius fracture.  At this point he can return to work in my opinion without any restrictions.  I will plan to see him back on an as-needed basis.  All of his questions were answered.  151 06/01/2021, 12:49 PM

## 2021-06-05 ENCOUNTER — Encounter (HOSPITAL_BASED_OUTPATIENT_CLINIC_OR_DEPARTMENT_OTHER): Payer: Self-pay | Admitting: Family Medicine

## 2021-06-12 ENCOUNTER — Other Ambulatory Visit: Payer: Self-pay | Admitting: Family Medicine

## 2021-06-27 ENCOUNTER — Other Ambulatory Visit: Payer: Self-pay | Admitting: Family Medicine

## 2021-06-27 DIAGNOSIS — F3342 Major depressive disorder, recurrent, in full remission: Secondary | ICD-10-CM

## 2021-06-28 ENCOUNTER — Other Ambulatory Visit: Payer: Self-pay | Admitting: Family Medicine

## 2021-07-11 ENCOUNTER — Other Ambulatory Visit: Payer: Self-pay | Admitting: Family Medicine

## 2021-07-20 ENCOUNTER — Emergency Department (HOSPITAL_COMMUNITY): Payer: 59

## 2021-07-20 ENCOUNTER — Encounter (HOSPITAL_COMMUNITY): Payer: Self-pay

## 2021-07-20 ENCOUNTER — Other Ambulatory Visit: Payer: Self-pay

## 2021-07-20 ENCOUNTER — Ambulatory Visit (INDEPENDENT_AMBULATORY_CARE_PROVIDER_SITE_OTHER): Payer: 59 | Admitting: Family Medicine

## 2021-07-20 ENCOUNTER — Emergency Department (HOSPITAL_COMMUNITY)
Admission: EM | Admit: 2021-07-20 | Discharge: 2021-07-20 | Disposition: A | Discharge status: Home / Self Care | Payer: 59 | Attending: Emergency Medicine | Admitting: Emergency Medicine

## 2021-07-20 ENCOUNTER — Encounter: Payer: Self-pay | Admitting: Family Medicine

## 2021-07-20 VITALS — BP 133/80 | HR 82 | Temp 98.2°F | Ht 68.0 in | Wt 202.2 lb

## 2021-07-20 DIAGNOSIS — I10 Essential (primary) hypertension: Secondary | ICD-10-CM | POA: Diagnosis not present

## 2021-07-20 DIAGNOSIS — R42 Dizziness and giddiness: Secondary | ICD-10-CM | POA: Diagnosis not present

## 2021-07-20 DIAGNOSIS — F3342 Major depressive disorder, recurrent, in full remission: Secondary | ICD-10-CM | POA: Diagnosis not present

## 2021-07-20 DIAGNOSIS — R7309 Other abnormal glucose: Secondary | ICD-10-CM | POA: Diagnosis not present

## 2021-07-20 DIAGNOSIS — R5383 Other fatigue: Secondary | ICD-10-CM | POA: Diagnosis not present

## 2021-07-20 DIAGNOSIS — Z5321 Procedure and treatment not carried out due to patient leaving prior to being seen by health care provider: Secondary | ICD-10-CM | POA: Insufficient documentation

## 2021-07-20 DIAGNOSIS — R531 Weakness: Secondary | ICD-10-CM | POA: Insufficient documentation

## 2021-07-20 LAB — COMPREHENSIVE METABOLIC PANEL
ALT: 23 U/L (ref 0–44)
AST: 23 U/L (ref 15–41)
Albumin: 4.2 g/dL (ref 3.5–5.0)
Alkaline Phosphatase: 72 U/L (ref 38–126)
Anion gap: 8 (ref 5–15)
BUN: 19 mg/dL (ref 8–23)
CO2: 22 mmol/L (ref 22–32)
Calcium: 8.7 mg/dL — ABNORMAL LOW (ref 8.9–10.3)
Chloride: 103 mmol/L (ref 98–111)
Creatinine, Ser: 0.69 mg/dL (ref 0.61–1.24)
GFR, Estimated: >60 mL/min (ref >60)
Glucose, Bld: 116 mg/dL — ABNORMAL HIGH (ref 70–99)
Potassium: 4 mmol/L (ref 3.5–5.1)
Sodium: 133 mmol/L — ABNORMAL LOW (ref 135–145)
Total Bilirubin: 0.8 mg/dL (ref 0.3–1.2)
Total Protein: 7.3 g/dL (ref 6.5–8.1)

## 2021-07-20 LAB — URINALYSIS, ROUTINE W REFLEX MICROSCOPIC
Bilirubin Urine: NEGATIVE
Glucose, UA: NEGATIVE mg/dL
Hgb urine dipstick: NEGATIVE
Ketones, ur: NEGATIVE mg/dL
Leukocytes,Ua: NEGATIVE
Nitrite: NEGATIVE
Protein, ur: NEGATIVE mg/dL
Specific Gravity, Urine: 1.015 (ref 1.005–1.030)
pH: 6 (ref 5.0–8.0)

## 2021-07-20 LAB — CBC WITH DIFFERENTIAL/PLATELET
Abs Immature Granulocytes: 0.02 10*3/uL (ref 0.00–0.07)
Basophils Absolute: 0 10*3/uL (ref 0.0–0.1)
Basophils Relative: 1 %
Eosinophils Absolute: 0.2 10*3/uL (ref 0.0–0.5)
Eosinophils Relative: 3 %
HCT: 45.1 % (ref 39.0–52.0)
Hemoglobin: 15.6 g/dL (ref 13.0–17.0)
Immature Granulocytes: 0 %
Lymphocytes Relative: 30 %
Lymphs Abs: 2.2 10*3/uL (ref 0.7–4.0)
MCH: 32 pg (ref 26.0–34.0)
MCHC: 34.6 g/dL (ref 30.0–36.0)
MCV: 92.6 fL (ref 80.0–100.0)
Monocytes Absolute: 0.6 10*3/uL (ref 0.1–1.0)
Monocytes Relative: 8 %
Neutro Abs: 4.3 10*3/uL (ref 1.7–7.7)
Neutrophils Relative %: 58 %
Platelets: 279 10*3/uL (ref 150–400)
RBC: 4.87 MIL/uL (ref 4.22–5.81)
RDW: 13.5 % (ref 11.5–15.5)
WBC: 7.3 10*3/uL (ref 4.0–10.5)
nRBC: 0 % (ref 0.0–0.2)

## 2021-07-20 LAB — CBG MONITORING, ED: Glucose-Capillary: 111 mg/dL — ABNORMAL HIGH (ref 70–99)

## 2021-07-20 LAB — BAYER DCA HB A1C WAIVED: HB A1C (BAYER DCA - WAIVED): 5.8 % — ABNORMAL HIGH (ref 4.8–5.6)

## 2021-07-20 MED ORDER — ESCITALOPRAM OXALATE 20 MG PO TABS: 20.0000 mg | ORAL_TABLET | Freq: Every day | ORAL | 1 refills | Status: DC

## 2021-07-20 NOTE — ED Provider Notes (Signed)
Emergency Medicine Provider Triage Evaluation Note  Theodore Gonzalez , a 65 y.o. male  was evaluated in triage.  Pt complains of dizziness and generalized weakness that has been constant for the past 3 days.  Patient notes his symptoms have also been intermittent for the past month however worsened over the past 3 days.  Patient describes dizziness as feeling off balance with associated blurry vision.  No changes to speech.  No unilateral weakness.  Denies associated chest pain and shortness of breath.  Recent illness.  Review of Systems  Positive: Dizziness, weakness Negative: Speech changes  Physical Exam  BP (!) 159/100 (BP Location: Left Arm)   Pulse 84   Temp 97.8 F (36.6 C) (Oral)   Resp 19   SpO2 95%  Gen:   Awake, no distress   Resp:  Normal effort  MSK:   Moves extremities without difficulty  Other:  No facial droop, equal grip strength, no pronator drift  Medical Decision Making  Medically screening exam initiated at 7:26 AM.  Appropriate orders placed.  ANDREE GOLPHIN was informed that the remainder of the evaluation will be completed by another provider, this initial triage assessment does not replace that evaluation, and the importance of remaining in the ED until their evaluation is complete.  Dizziness and generalized weakness CT head Labs EKG   Jesusita Oka 07/20/21 1224    Ernie Avena, MD 07/20/21 0900

## 2021-07-20 NOTE — ED Triage Notes (Signed)
Pt complains of dizziness and weakness x 3 days. Pt reports taking a blood pressure medication and a medication for stress.

## 2021-07-20 NOTE — Progress Notes (Signed)
Assessment & Plan:  1. Recurrent major depressive disorder, in full remission (HCC) - increasing Lexapro from 15 mg to 20 mg as this may be contributing to fatigue - escitalopram (LEXAPRO) 20 MG tablet; Take 1 tablet (20 mg total) by mouth daily.  Dispense: 30 tablet; Refill: 1  2. Fatigue, unspecified type - ordering lab work to rule out conditions that could be related to fatigue - PSA, total and free - TSH - VITAMIN D 25 Hydroxy (Vit-D Deficiency, Fractures) - Lipid panel - Bayer DCA Hb A1c Waived - Testosterone,Free and Total  3. Elevated glucose - Bayer DCA Hb A1c Waived  4. Primary hypertension - continue lisinopril as prescribed - encouraged to keep a log of blood pressures  - TSH - Lipid panel   Follow up plan: Return in about 4 weeks (around 08/17/2021) for Fatigue/Lexapro with PCP.  Floy Sabina, NP Student  I personally was present during the history, physical exam, and medical decision-making activities of this service and have verified that the service and findings are accurately documented in the nurse practitioner student's note.  Deliah Boston, MSN, APRN, FNP-C [facility]   Subjective:   Patient ID: Theodore Gonzalez, male    DOB: 05-21-1956, 65 y.o.   MRN: [id number]  HPI: Theodore Gonzalez is a 65 y.o. male presenting on 07/20/2021 for ER follow up  (Weakness & dizzy 10/27- [facility])  He states he has had fatigue that began approximately a month ago but has progressed over the last week. He went to the ER this morning at 0648 and left before being seen at 1000 after getting labs, an EKG, and a head CT. His lab work shows no significant abnormality thus far and his head CT was normal. His EKG was NSR with evidence of RVH.   He states he does not check his blood pressure consistently at home but will occasionally check at work, getting readings around 140/80. He takes lisinopril 5 mg daily for his hypertension. He denies headache, SOB, chest pain,  indigestion. He states he occasionally gets night sweats about once per week. He denies a cough.   He states he gets enough sleep. He states the fatigue is worse in the morning and he does not want to go to work or do anything. He denies recent exposure to insect bites or international travel.   He has known diagnosis of major depression that he is taking Lexapro 15 mg daily for. He states he is doing well on this. He does mention that the last time this happened to him he was placed on Lexapro and it helped some. He is asking if this can be increased to see if it helps.   Depression screen [facility] 2/9 07/20/2021 12/09/2020 06/16/2020  Decreased Interest 1 0 0  Down, Depressed, Hopeless 0 0 0  PHQ - 2 Score 1 0 0  Altered sleeping 0 0 -  Tired, decreased energy 3 0 -  Change in appetite 0 0 -  Feeling bad or failure about yourself  0 0 -  Trouble concentrating 0 0 -  Moving slowly or fidgety/restless 0 0 -  Suicidal thoughts 0 0 -  PHQ-9 Score 4 0 -  Difficult doing work/chores Not difficult at all - -   GAD 7 : Generalized Anxiety Score 07/20/2021  Nervous, Anxious, on Edge 0  Control/stop worrying 0  Worry too much - different things 0  Trouble relaxing 0  Restless 0  Easily annoyed or irritable  0  Afraid - awful might happen 1  Total GAD 7 Score 1  Anxiety Difficulty Not difficult at all    ROS: Negative unless specifically indicated above in HPI.   Relevant past medical history reviewed and updated as indicated.   Allergies and medications reviewed and updated.   Current Outpatient Medications:    atorvastatin (LIPITOR) 40 MG tablet, TAKE ONE TABLET (40 MG TOTAL) BY MOUTH DAILY., Disp: 90 tablet, Rfl: 3   fluticasone (FLONASE) 50 MCG/ACT nasal spray, Place 2 sprays into both nostrils daily., Disp: 16 g, Rfl: 6   gabapentin (NEURONTIN) 300 MG capsule, Take 300 mg by mouth 2 (two) times daily. , Disp: , Rfl:    lisinopril (ZESTRIL) 5 MG tablet, Take 1 tablet (5 mg total) by  mouth daily., Disp: 90 tablet, Rfl: 3   naloxone (NARCAN) nasal spray 4 mg/0.1 mL, Place into the nose., Disp: , Rfl:    omeprazole (PRILOSEC) 20 MG capsule, TAKE 1 CAPSULE BY MOUTH EVERY DAY, Disp: 90 capsule, Rfl: 3   oxyCODONE (ROXICODONE) 15 MG immediate release tablet, Take 15 mg by mouth every 6 (six) hours as needed for pain., Disp: , Rfl:    valACYclovir (VALTREX) 500 MG tablet, Take 1 tablet (500 mg total) by mouth 2 (two) times daily. (NEEDS TO BE SEEN BEFORE NEXT REFILL), Disp: 60 tablet, Rfl: 0   escitalopram (LEXAPRO) 20 MG tablet, Take 1 tablet (20 mg total) by mouth daily., Disp: 30 tablet, Rfl: 1   tamsulosin (FLOMAX) 0.4 MG CAPS capsule, Take 0.4 mg by mouth daily. (Patient not taking: Reported on 07/20/2021), Disp: , Rfl:   No Known Allergies  Objective:   BP 133/80   Pulse 82   Temp 98.2 F (36.8 C) (Temporal)   Ht 5\' 8"  (1.727 m)   Wt 91.7 kg   SpO2 91%   BMI 30.74 kg/m    Physical Exam Vitals reviewed.  Constitutional:      General: He is not in acute distress.    Appearance: Normal appearance. He is obese. He is not ill-appearing, toxic-appearing or diaphoretic.  HENT:     Head: Normocephalic and atraumatic.  Eyes:     General: No scleral icterus.       Right eye: No discharge.        Left eye: No discharge.     Conjunctiva/sclera: Conjunctivae normal.  Cardiovascular:     Rate and Rhythm: Normal rate and regular rhythm.     Heart sounds: Normal heart sounds. No murmur heard.   No friction rub. No gallop.  Pulmonary:     Effort: Pulmonary effort is normal. No respiratory distress.     Breath sounds: Normal breath sounds. No stridor. No wheezing, rhonchi or rales.  Musculoskeletal:        General: Normal range of motion.     Cervical back: Normal range of motion.     Right lower leg: No edema.     Left lower leg: No edema.  Skin:    General: Skin is warm and dry.  Neurological:     Mental Status: He is alert and oriented to person, place, and  time. Mental status is at baseline.  Psychiatric:        Mood and Affect: Mood normal.        Behavior: Behavior normal.        Thought Content: Thought content normal.        Judgment: Judgment normal.

## 2021-07-21 ENCOUNTER — Encounter: Payer: Self-pay | Admitting: Family Medicine

## 2021-07-21 DIAGNOSIS — R7989 Other specified abnormal findings of blood chemistry: Secondary | ICD-10-CM

## 2021-07-21 DIAGNOSIS — E559 Vitamin D deficiency, unspecified: Secondary | ICD-10-CM

## 2021-07-21 DIAGNOSIS — R7303 Prediabetes: Secondary | ICD-10-CM

## 2021-07-21 HISTORY — DX: Other specified abnormal findings of blood chemistry: R79.89

## 2021-07-21 HISTORY — DX: Prediabetes: R73.03

## 2021-07-21 HISTORY — DX: Vitamin D deficiency, unspecified: E55.9

## 2021-07-22 LAB — PSA, TOTAL AND FREE
PSA, Free Pct: 26.7 %
PSA, Free: 0.16 ng/mL
Prostate Specific Ag, Serum: 0.6 ng/mL (ref 0.0–4.0)

## 2021-07-22 LAB — LIPID PANEL
Chol/HDL Ratio: 4.5 ratio (ref 0.0–5.0)
Cholesterol, Total: 183 mg/dL (ref 100–199)
HDL: 41 mg/dL (ref >39)
LDL Chol Calc (NIH): 95 mg/dL (ref 0–99)
Triglycerides: 282 mg/dL — ABNORMAL HIGH (ref 0–149)
VLDL Cholesterol Cal: 47 mg/dL — ABNORMAL HIGH (ref 5–40)

## 2021-07-22 LAB — TESTOSTERONE,FREE AND TOTAL
Testosterone, Free: 2.4 pg/mL — ABNORMAL LOW (ref 6.6–18.1)
Testosterone: 249 ng/dL — ABNORMAL LOW (ref 264–916)

## 2021-07-22 LAB — VITAMIN D 25 HYDROXY (VIT D DEFICIENCY, FRACTURES): Vit D, 25-Hydroxy: 22.8 ng/mL — ABNORMAL LOW (ref 30.0–100.0)

## 2021-07-22 LAB — TSH: TSH: 1.19 u[IU]/mL (ref 0.450–4.500)

## 2021-08-04 ENCOUNTER — Other Ambulatory Visit: Payer: Self-pay | Admitting: Family Medicine

## 2021-08-04 DIAGNOSIS — M5412 Radiculopathy, cervical region: Secondary | ICD-10-CM

## 2021-08-09 ENCOUNTER — Other Ambulatory Visit: Payer: Self-pay | Admitting: Family Medicine

## 2021-08-11 ENCOUNTER — Other Ambulatory Visit: Payer: Self-pay | Admitting: Family Medicine

## 2021-08-11 DIAGNOSIS — F3342 Major depressive disorder, recurrent, in full remission: Secondary | ICD-10-CM

## 2021-08-23 ENCOUNTER — Ambulatory Visit: Payer: 59 | Admitting: Family Medicine

## 2021-08-24 ENCOUNTER — Encounter: Payer: Self-pay | Admitting: Family Medicine

## 2021-08-28 ENCOUNTER — Telehealth: Payer: Self-pay

## 2021-08-28 NOTE — Telephone Encounter (Signed)
Patient called to cancel any upcoming appts at [facility] and to let us know that he has moved to [facility] and has found new PCP closer to where he lives.

## 2021-08-30 ENCOUNTER — Ambulatory Visit: Payer: 59 | Admitting: Family Medicine

## 2021-09-17 ENCOUNTER — Other Ambulatory Visit: Payer: Self-pay | Admitting: Family Medicine

## 2021-09-17 DIAGNOSIS — F3342 Major depressive disorder, recurrent, in full remission: Secondary | ICD-10-CM

## 2021-11-11 ENCOUNTER — Other Ambulatory Visit: Payer: Self-pay | Admitting: Family Medicine

## 2021-11-11 DIAGNOSIS — E782 Mixed hyperlipidemia: Secondary | ICD-10-CM

## 2021-11-13 NOTE — Addendum Note (Signed)
Addended by: Thana Ates on: 11/13/2021 11:48 AM   Modules accepted: Orders

## 2021-11-16 ENCOUNTER — Other Ambulatory Visit: Payer: Self-pay | Admitting: Family Medicine

## 2021-11-16 DIAGNOSIS — E782 Mixed hyperlipidemia: Secondary | ICD-10-CM

## 2021-12-11 DIAGNOSIS — G8929 Other chronic pain: Secondary | ICD-10-CM | POA: Diagnosis not present

## 2021-12-11 DIAGNOSIS — M19071 Primary osteoarthritis, right ankle and foot: Secondary | ICD-10-CM | POA: Diagnosis not present

## 2021-12-11 DIAGNOSIS — Z9889 Other specified postprocedural states: Secondary | ICD-10-CM | POA: Diagnosis not present

## 2021-12-11 DIAGNOSIS — M25571 Pain in right ankle and joints of right foot: Secondary | ICD-10-CM | POA: Diagnosis not present

## 2021-12-11 DIAGNOSIS — T07XXXA Unspecified multiple injuries, initial encounter: Secondary | ICD-10-CM | POA: Diagnosis not present

## 2021-12-12 DIAGNOSIS — I10 Essential (primary) hypertension: Secondary | ICD-10-CM | POA: Diagnosis not present

## 2021-12-12 DIAGNOSIS — G894 Chronic pain syndrome: Secondary | ICD-10-CM | POA: Diagnosis not present

## 2021-12-16 ENCOUNTER — Other Ambulatory Visit: Payer: Self-pay | Admitting: Family Medicine

## 2021-12-16 DIAGNOSIS — F3342 Major depressive disorder, recurrent, in full remission: Secondary | ICD-10-CM

## 2021-12-26 DIAGNOSIS — I10 Essential (primary) hypertension: Secondary | ICD-10-CM | POA: Diagnosis not present

## 2021-12-26 DIAGNOSIS — E559 Vitamin D deficiency, unspecified: Secondary | ICD-10-CM | POA: Diagnosis not present

## 2021-12-26 DIAGNOSIS — R7303 Prediabetes: Secondary | ICD-10-CM | POA: Diagnosis not present

## 2021-12-26 DIAGNOSIS — E7800 Pure hypercholesterolemia, unspecified: Secondary | ICD-10-CM | POA: Diagnosis not present

## 2022-02-06 DIAGNOSIS — M79673 Pain in unspecified foot: Secondary | ICD-10-CM | POA: Diagnosis not present

## 2022-02-06 DIAGNOSIS — M7741 Metatarsalgia, right foot: Secondary | ICD-10-CM | POA: Diagnosis not present

## 2022-02-06 DIAGNOSIS — M7742 Metatarsalgia, left foot: Secondary | ICD-10-CM | POA: Diagnosis not present

## 2022-02-09 DIAGNOSIS — G4733 Obstructive sleep apnea (adult) (pediatric): Secondary | ICD-10-CM | POA: Diagnosis not present

## 2022-03-06 DIAGNOSIS — Z5181 Encounter for therapeutic drug level monitoring: Secondary | ICD-10-CM | POA: Diagnosis not present

## 2022-03-06 DIAGNOSIS — M25572 Pain in left ankle and joints of left foot: Secondary | ICD-10-CM | POA: Diagnosis not present

## 2022-03-06 DIAGNOSIS — M25571 Pain in right ankle and joints of right foot: Secondary | ICD-10-CM | POA: Diagnosis not present

## 2022-03-06 DIAGNOSIS — T07XXXA Unspecified multiple injuries, initial encounter: Secondary | ICD-10-CM | POA: Diagnosis not present

## 2022-03-06 DIAGNOSIS — G8929 Other chronic pain: Secondary | ICD-10-CM | POA: Diagnosis not present

## 2022-03-06 DIAGNOSIS — Z9889 Other specified postprocedural states: Secondary | ICD-10-CM | POA: Diagnosis not present

## 2022-03-06 DIAGNOSIS — Z79899 Other long term (current) drug therapy: Secondary | ICD-10-CM | POA: Diagnosis not present

## 2022-03-13 ENCOUNTER — Other Ambulatory Visit: Payer: Self-pay | Admitting: Family Medicine

## 2022-03-13 DIAGNOSIS — F3342 Major depressive disorder, recurrent, in full remission: Secondary | ICD-10-CM

## 2022-04-05 ENCOUNTER — Other Ambulatory Visit: Payer: Self-pay | Admitting: Family Medicine

## 2022-04-05 DIAGNOSIS — F3342 Major depressive disorder, recurrent, in full remission: Secondary | ICD-10-CM

## 2022-04-13 DIAGNOSIS — N138 Other obstructive and reflux uropathy: Secondary | ICD-10-CM | POA: Diagnosis not present

## 2022-05-29 DIAGNOSIS — Z1159 Encounter for screening for other viral diseases: Secondary | ICD-10-CM | POA: Diagnosis not present

## 2022-05-29 DIAGNOSIS — Z23 Encounter for immunization: Secondary | ICD-10-CM | POA: Diagnosis not present

## 2022-05-29 DIAGNOSIS — E7800 Pure hypercholesterolemia, unspecified: Secondary | ICD-10-CM | POA: Diagnosis not present

## 2022-05-29 DIAGNOSIS — G629 Polyneuropathy, unspecified: Secondary | ICD-10-CM | POA: Diagnosis not present

## 2022-05-29 DIAGNOSIS — I10 Essential (primary) hypertension: Secondary | ICD-10-CM | POA: Diagnosis not present

## 2022-05-29 DIAGNOSIS — Z0000 Encounter for general adult medical examination without abnormal findings: Secondary | ICD-10-CM | POA: Diagnosis not present

## 2022-05-29 DIAGNOSIS — R7303 Prediabetes: Secondary | ICD-10-CM | POA: Diagnosis not present

## 2022-05-31 ENCOUNTER — Other Ambulatory Visit: Payer: Self-pay | Admitting: Family Medicine

## 2022-05-31 DIAGNOSIS — Z87891 Personal history of nicotine dependence: Secondary | ICD-10-CM

## 2022-06-09 ENCOUNTER — Other Ambulatory Visit: Payer: Self-pay | Admitting: Family Medicine

## 2022-06-09 DIAGNOSIS — I10 Essential (primary) hypertension: Secondary | ICD-10-CM

## 2022-06-12 DIAGNOSIS — Z9889 Other specified postprocedural states: Secondary | ICD-10-CM | POA: Diagnosis not present

## 2022-06-12 DIAGNOSIS — T07XXXA Unspecified multiple injuries, initial encounter: Secondary | ICD-10-CM | POA: Diagnosis not present

## 2022-06-12 DIAGNOSIS — G8929 Other chronic pain: Secondary | ICD-10-CM | POA: Diagnosis not present

## 2022-06-12 DIAGNOSIS — M25571 Pain in right ankle and joints of right foot: Secondary | ICD-10-CM | POA: Diagnosis not present

## 2022-06-21 DIAGNOSIS — M79672 Pain in left foot: Secondary | ICD-10-CM | POA: Diagnosis not present

## 2022-06-21 DIAGNOSIS — M79671 Pain in right foot: Secondary | ICD-10-CM | POA: Diagnosis not present

## 2022-06-21 DIAGNOSIS — G5761 Lesion of plantar nerve, right lower limb: Secondary | ICD-10-CM | POA: Diagnosis not present

## 2022-06-21 DIAGNOSIS — G5762 Lesion of plantar nerve, left lower limb: Secondary | ICD-10-CM | POA: Diagnosis not present

## 2022-06-25 DIAGNOSIS — N138 Other obstructive and reflux uropathy: Secondary | ICD-10-CM | POA: Diagnosis not present

## 2022-07-26 DIAGNOSIS — N138 Other obstructive and reflux uropathy: Secondary | ICD-10-CM | POA: Diagnosis not present

## 2022-08-01 DIAGNOSIS — Z01812 Encounter for preprocedural laboratory examination: Secondary | ICD-10-CM | POA: Diagnosis not present

## 2022-08-01 DIAGNOSIS — Z01810 Encounter for preprocedural cardiovascular examination: Secondary | ICD-10-CM | POA: Diagnosis not present

## 2022-08-08 DIAGNOSIS — N138 Other obstructive and reflux uropathy: Secondary | ICD-10-CM | POA: Diagnosis not present

## 2022-08-09 DIAGNOSIS — N138 Other obstructive and reflux uropathy: Secondary | ICD-10-CM | POA: Diagnosis not present

## 2022-09-06 DIAGNOSIS — M25571 Pain in right ankle and joints of right foot: Secondary | ICD-10-CM | POA: Diagnosis not present

## 2022-09-06 DIAGNOSIS — G894 Chronic pain syndrome: Secondary | ICD-10-CM | POA: Diagnosis not present

## 2022-09-06 DIAGNOSIS — T07XXXA Unspecified multiple injuries, initial encounter: Secondary | ICD-10-CM | POA: Diagnosis not present

## 2022-09-06 DIAGNOSIS — Z79899 Other long term (current) drug therapy: Secondary | ICD-10-CM | POA: Diagnosis not present

## 2022-09-06 DIAGNOSIS — Z5181 Encounter for therapeutic drug level monitoring: Secondary | ICD-10-CM | POA: Diagnosis not present

## 2022-11-08 DIAGNOSIS — T07XXXA Unspecified multiple injuries, initial encounter: Secondary | ICD-10-CM | POA: Diagnosis not present

## 2022-11-08 DIAGNOSIS — M25571 Pain in right ankle and joints of right foot: Secondary | ICD-10-CM | POA: Diagnosis not present

## 2022-11-08 DIAGNOSIS — Z9889 Other specified postprocedural states: Secondary | ICD-10-CM | POA: Diagnosis not present

## 2022-11-08 DIAGNOSIS — G8929 Other chronic pain: Secondary | ICD-10-CM | POA: Diagnosis not present

## 2022-11-15 DIAGNOSIS — G44219 Episodic tension-type headache, not intractable: Secondary | ICD-10-CM | POA: Diagnosis not present

## 2022-12-03 DIAGNOSIS — R7303 Prediabetes: Secondary | ICD-10-CM | POA: Diagnosis not present

## 2022-12-03 DIAGNOSIS — I10 Essential (primary) hypertension: Secondary | ICD-10-CM | POA: Diagnosis not present

## 2022-12-03 DIAGNOSIS — E7800 Pure hypercholesterolemia, unspecified: Secondary | ICD-10-CM | POA: Diagnosis not present

## 2022-12-11 DIAGNOSIS — G5762 Lesion of plantar nerve, left lower limb: Secondary | ICD-10-CM | POA: Diagnosis not present

## 2022-12-11 DIAGNOSIS — M79673 Pain in unspecified foot: Secondary | ICD-10-CM | POA: Diagnosis not present

## 2022-12-11 DIAGNOSIS — G5761 Lesion of plantar nerve, right lower limb: Secondary | ICD-10-CM | POA: Diagnosis not present

## 2023-02-07 DIAGNOSIS — L814 Other melanin hyperpigmentation: Secondary | ICD-10-CM | POA: Diagnosis not present

## 2023-02-07 DIAGNOSIS — L738 Other specified follicular disorders: Secondary | ICD-10-CM | POA: Diagnosis not present

## 2023-02-14 DIAGNOSIS — T07XXXA Unspecified multiple injuries, initial encounter: Secondary | ICD-10-CM | POA: Diagnosis not present

## 2023-02-14 DIAGNOSIS — G8929 Other chronic pain: Secondary | ICD-10-CM | POA: Diagnosis not present

## 2023-02-14 DIAGNOSIS — M25572 Pain in left ankle and joints of left foot: Secondary | ICD-10-CM | POA: Diagnosis not present

## 2023-02-14 DIAGNOSIS — Z9889 Other specified postprocedural states: Secondary | ICD-10-CM | POA: Diagnosis not present

## 2023-02-14 DIAGNOSIS — M25571 Pain in right ankle and joints of right foot: Secondary | ICD-10-CM | POA: Diagnosis not present

## 2023-03-14 DIAGNOSIS — R350 Frequency of micturition: Secondary | ICD-10-CM | POA: Diagnosis not present

## 2023-04-04 DIAGNOSIS — M25571 Pain in right ankle and joints of right foot: Secondary | ICD-10-CM | POA: Diagnosis not present

## 2023-04-04 DIAGNOSIS — Z5181 Encounter for therapeutic drug level monitoring: Secondary | ICD-10-CM | POA: Diagnosis not present

## 2023-04-04 DIAGNOSIS — G8929 Other chronic pain: Secondary | ICD-10-CM | POA: Diagnosis not present

## 2023-04-04 DIAGNOSIS — Z79899 Other long term (current) drug therapy: Secondary | ICD-10-CM | POA: Diagnosis not present

## 2023-04-04 DIAGNOSIS — T07XXXA Unspecified multiple injuries, initial encounter: Secondary | ICD-10-CM | POA: Diagnosis not present

## 2023-04-04 DIAGNOSIS — Z9889 Other specified postprocedural states: Secondary | ICD-10-CM | POA: Diagnosis not present

## 2023-04-07 IMAGING — DX DG WRIST COMPLETE 3+V*R*
3 series · 3 of 3 positions shown · non-contrast
Comparison: None.

CLINICAL DATA: Pain following fall

EXAM:
RIGHT WRIST - COMPLETE 3+ VIEW

[wrist pa]
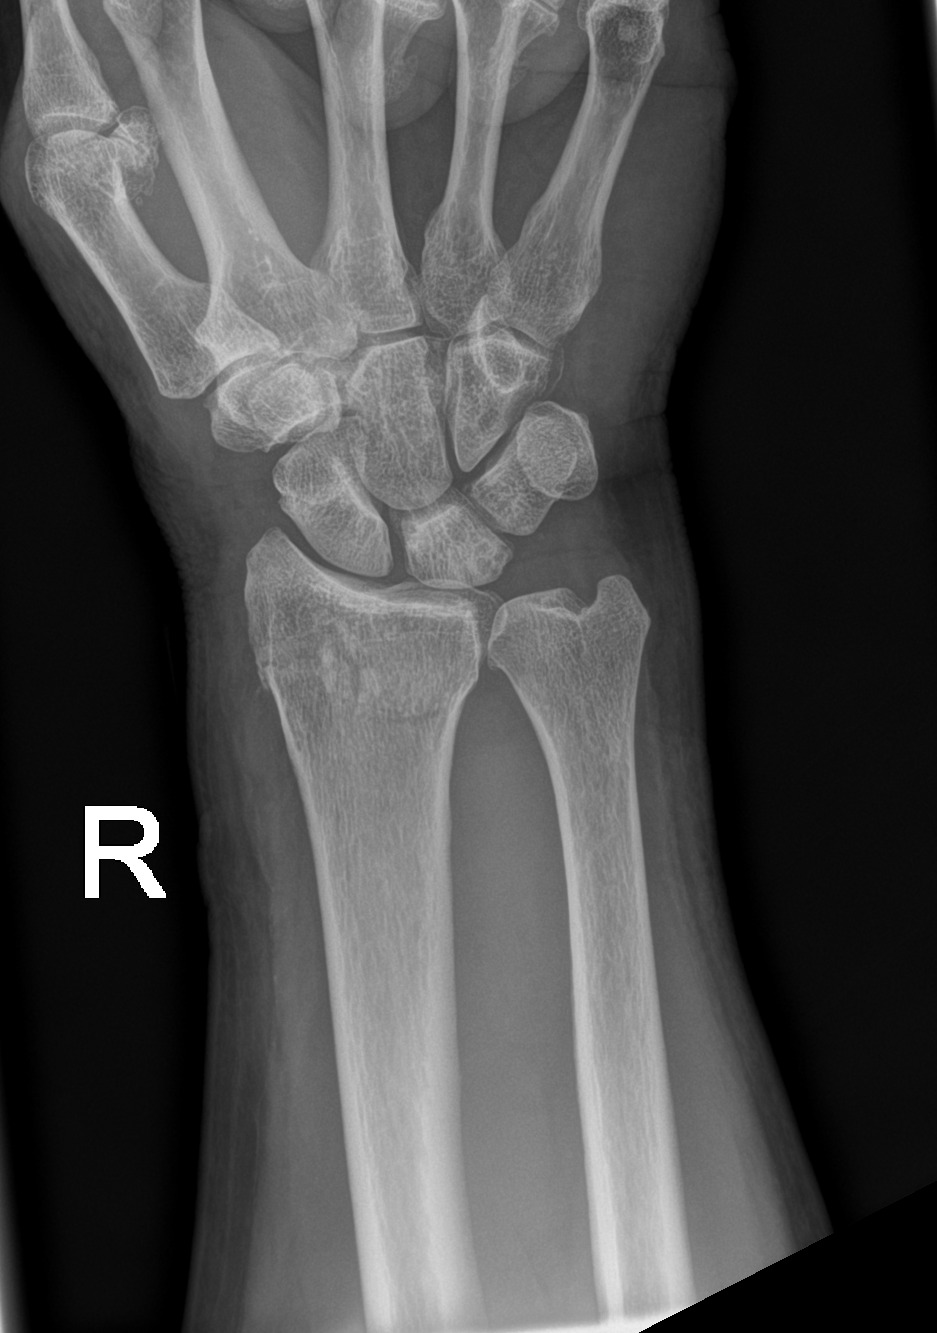

[wrist obl]
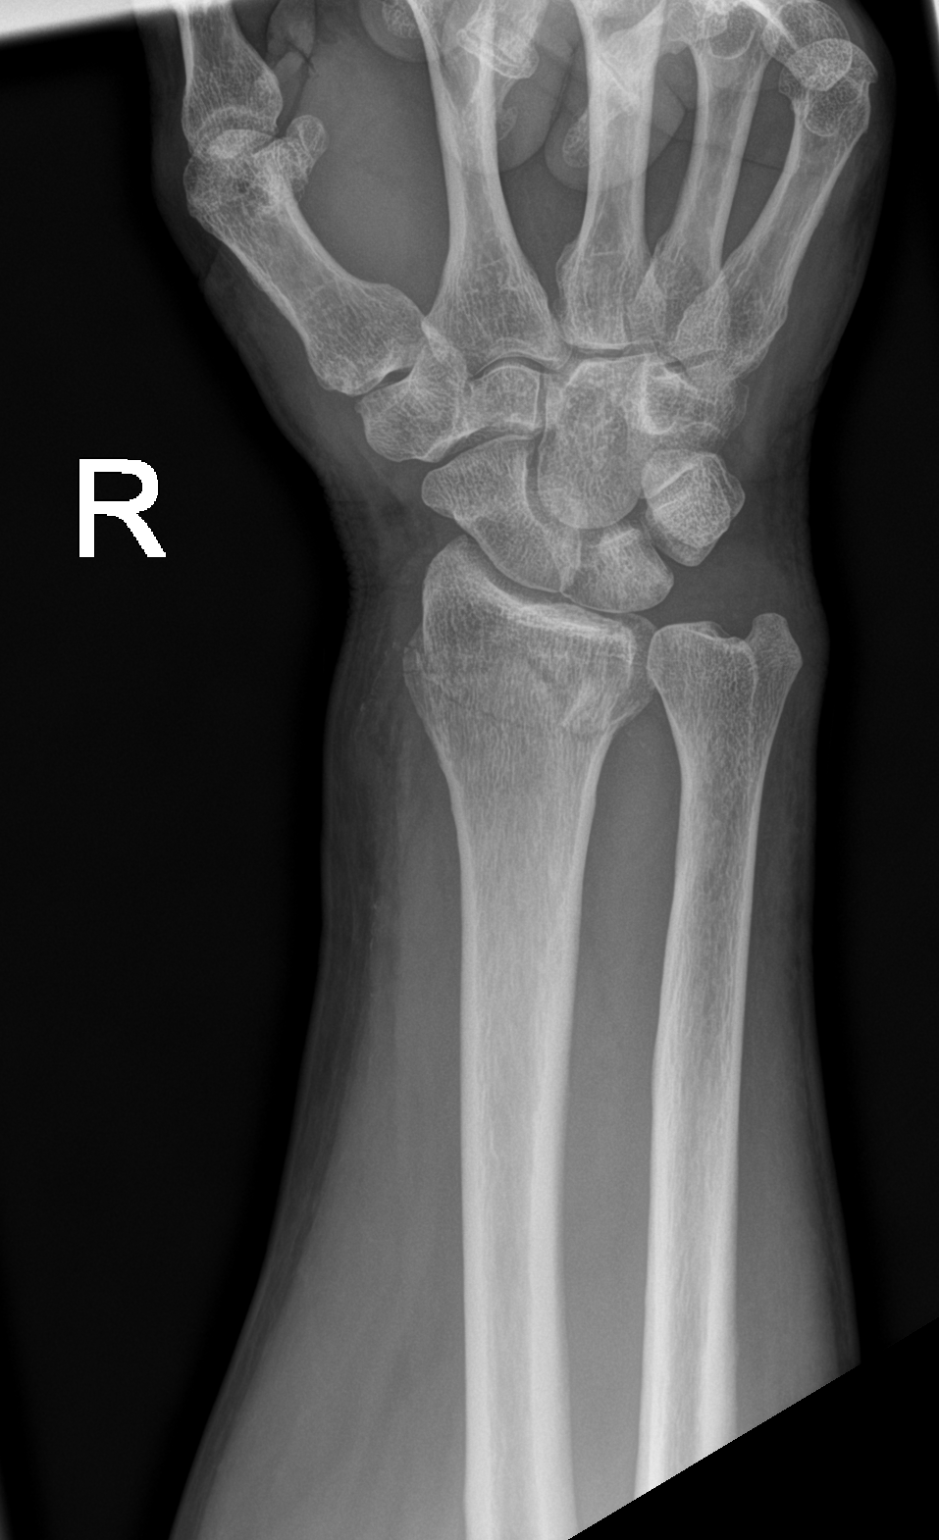

[wrist lat]
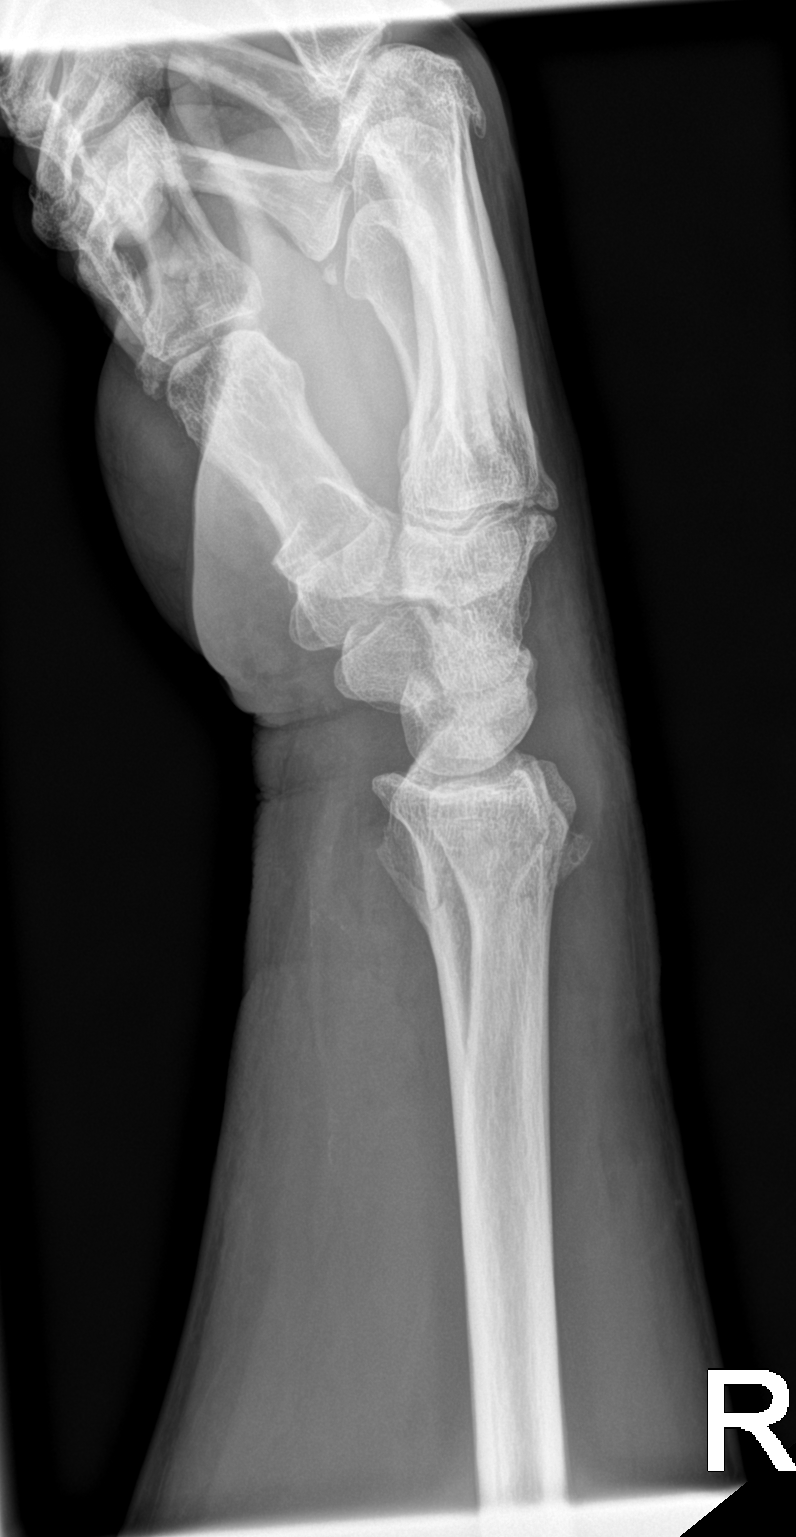

[3 of 3 positions shown; findings below may reference images not displayed]

FINDINGS: Frontal, oblique, and lateral views were obtained. There is a
comminuted fracture of the distal radial metaphysis with impaction
at the fracture site. No other fractures are appreciable. No
dislocation. Joint spaces appear unremarkable.
IMPRESSION: Comminuted fracture distal radial metaphysis with impaction fracture
site. Overall alignment near anatomic. No other fracture. No
dislocation. No significant joint space narrowing.

## 2023-04-07 IMAGING — DX DG TIBIA/FIBULA 2V*L*
3 series · 3 of 3 positions shown · non-contrast
Comparison: None.

CLINICAL DATA: Pain following fall

EXAM:
LEFT TIBIA AND FIBULA - 2 VIEW

[tibia ap (1 of 2)]
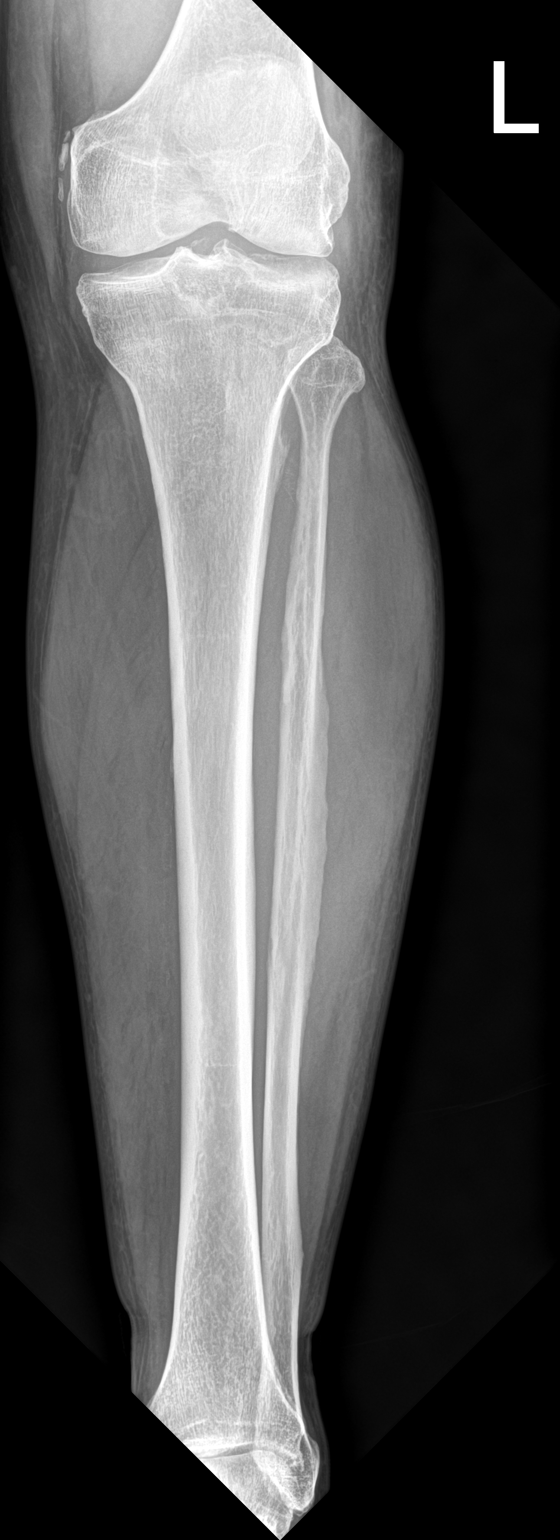

[tibia ap (2 of 2)]
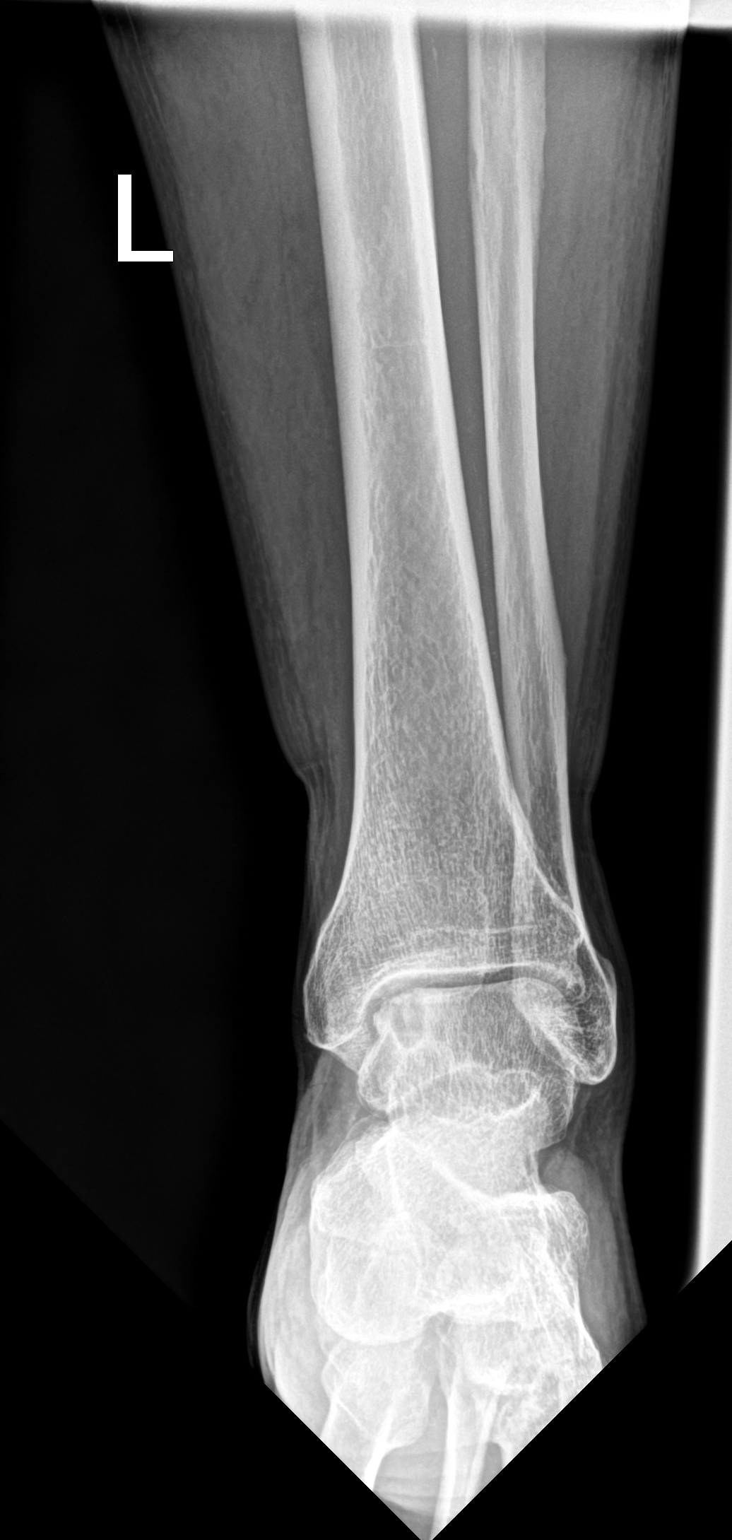

[tibia lat]
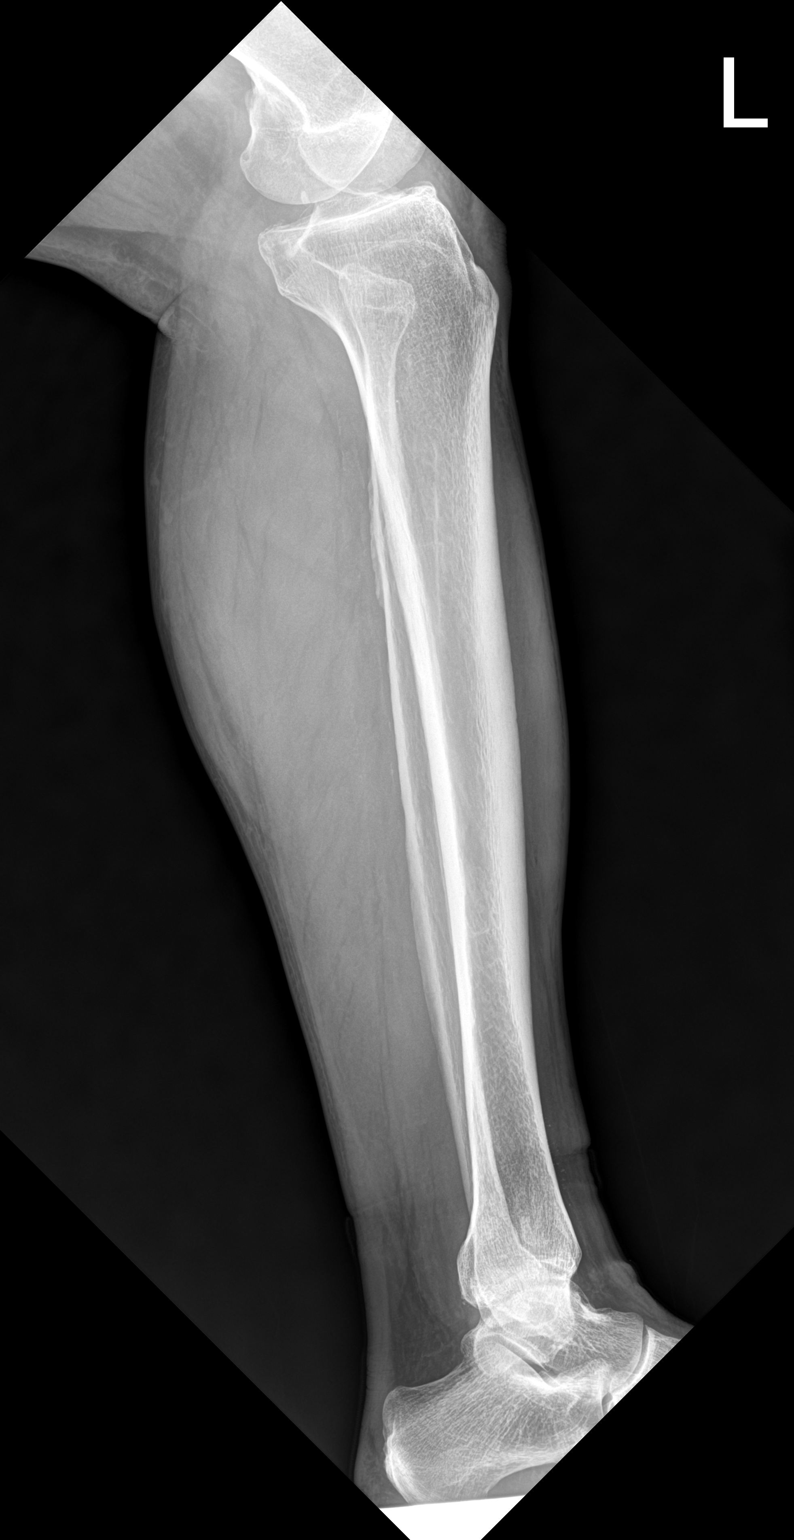

[3 of 3 positions shown; findings below may reference images not displayed]

FINDINGS: Frontal and lateral views obtained. No acute fracture or
dislocation. Smooth periosteal reaction along mid fibula potentially
may represent residua of old trauma. There is calcification medial
to the medial distal femoral condyle, likely due to calcific
tendinosis. No erosions.
IMPRESSION: No fracture or dislocation. Suspect calcific tendinosis medial to
the medial distal femoral condyle. Benign periosteal reaction mid
fibula potentially may represent residua of old trauma.

## 2023-04-07 IMAGING — DX DG ELBOW COMPLETE 3+V*L*
5 series · 5 of 5 positions shown · non-contrast
Comparison: None.

CLINICAL DATA: Pain following fall

EXAM:
LEFT ELBOW - COMPLETE 3+ VIEW

[elbow ap (1 of 2)]
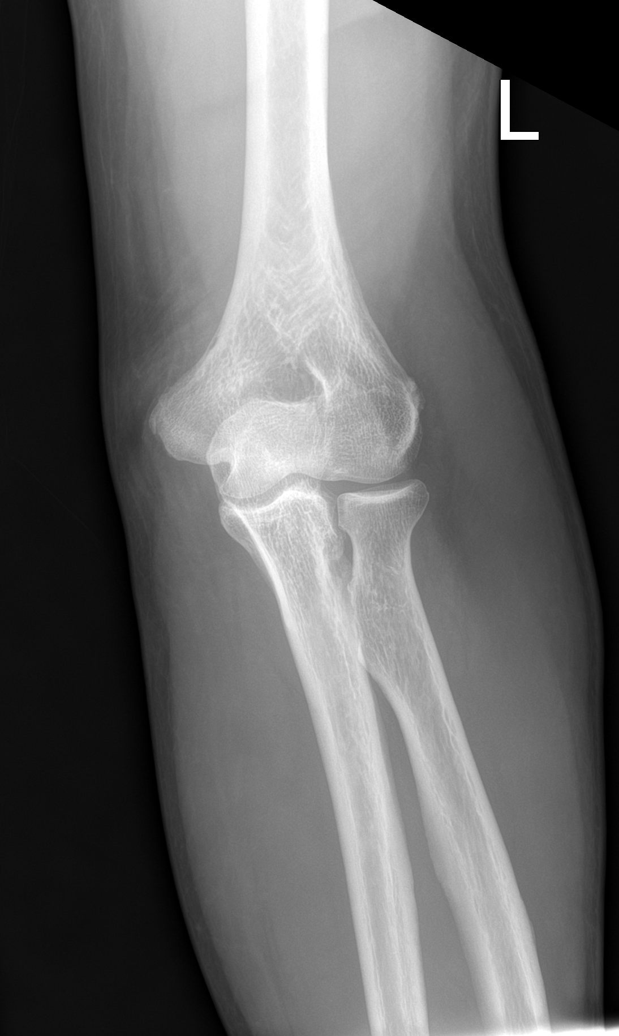

[elbow obl (1 of 2)]
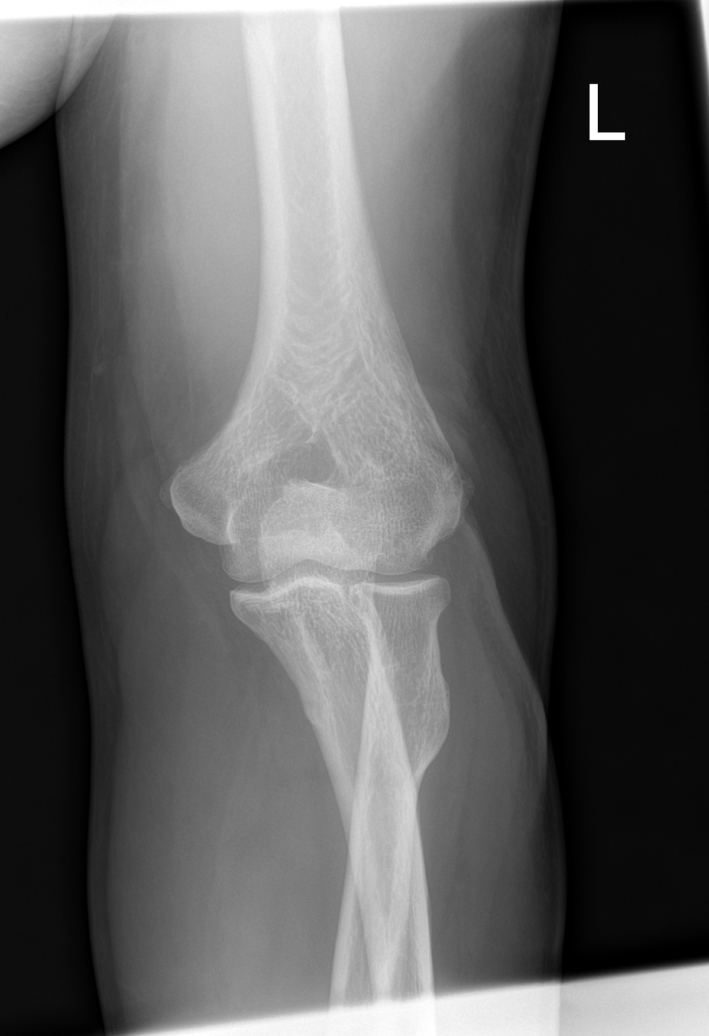

[elbow obl (2 of 2)]
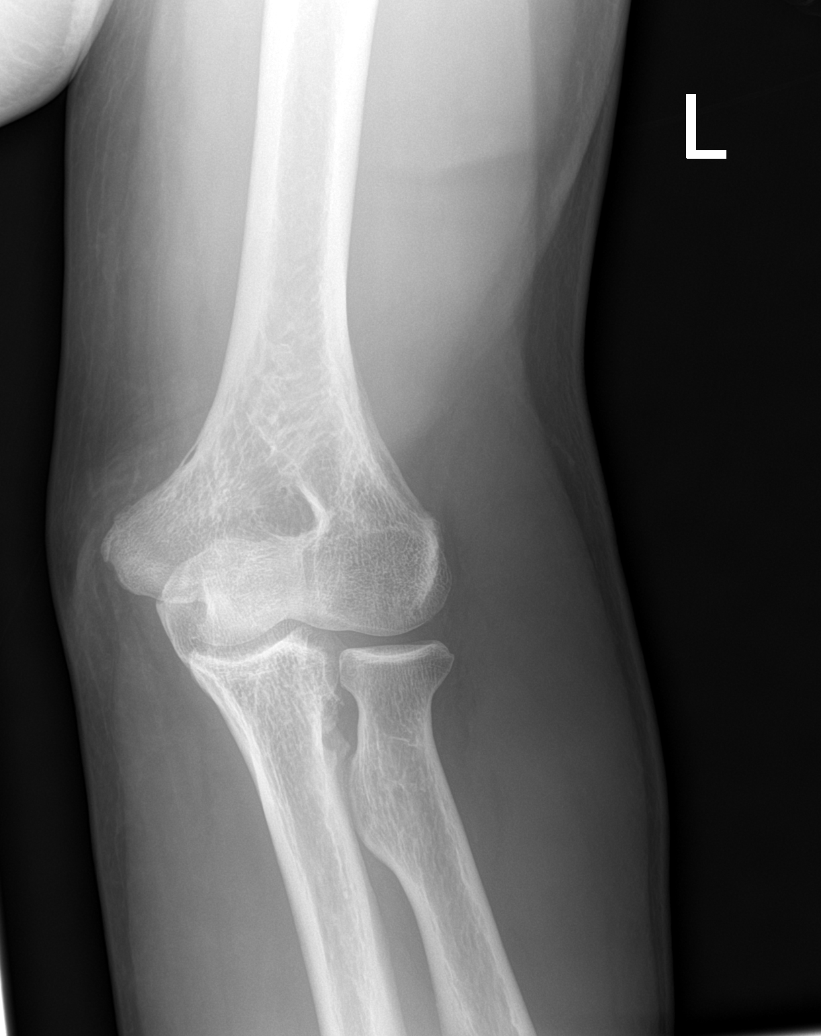

[elbow lat]
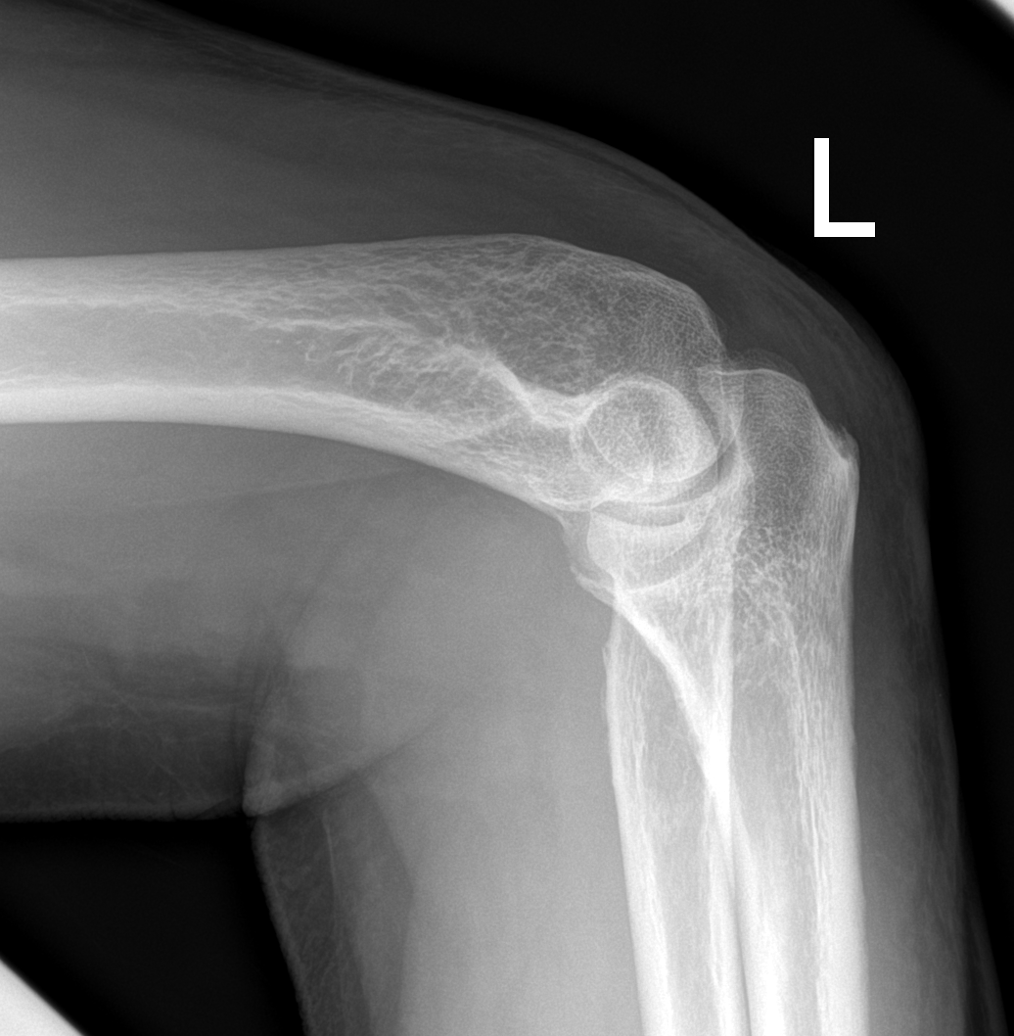

[elbow ap (2 of 2)]
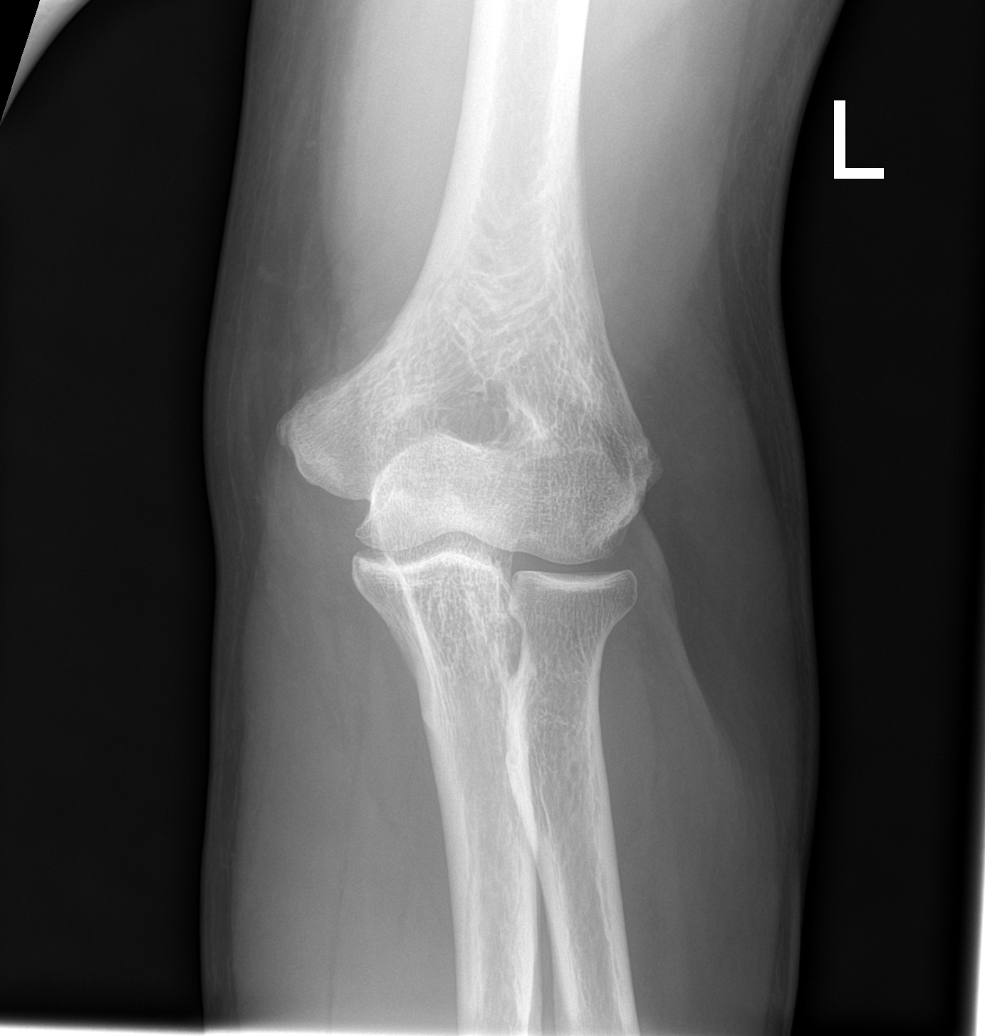

[5 of 5 positions shown; findings below may reference images not displayed]

FINDINGS: Frontal, lateral, and bilateral oblique views were obtained. There
is no appreciable fracture or dislocation. No joint effusion. No
appreciable joint space narrowing or erosion.
IMPRESSION: No fracture or dislocation. No appreciable joint space narrowing or
erosion.

## 2023-04-07 IMAGING — DX DG CHEST 1V PORT
2 series · 2 of 2 positions shown · non-contrast
Comparison: November 11, 2019

CLINICAL DATA: Pain following fall

EXAM:
PORTABLE CHEST 1 VIEW

[chest ap (1 of 2)]
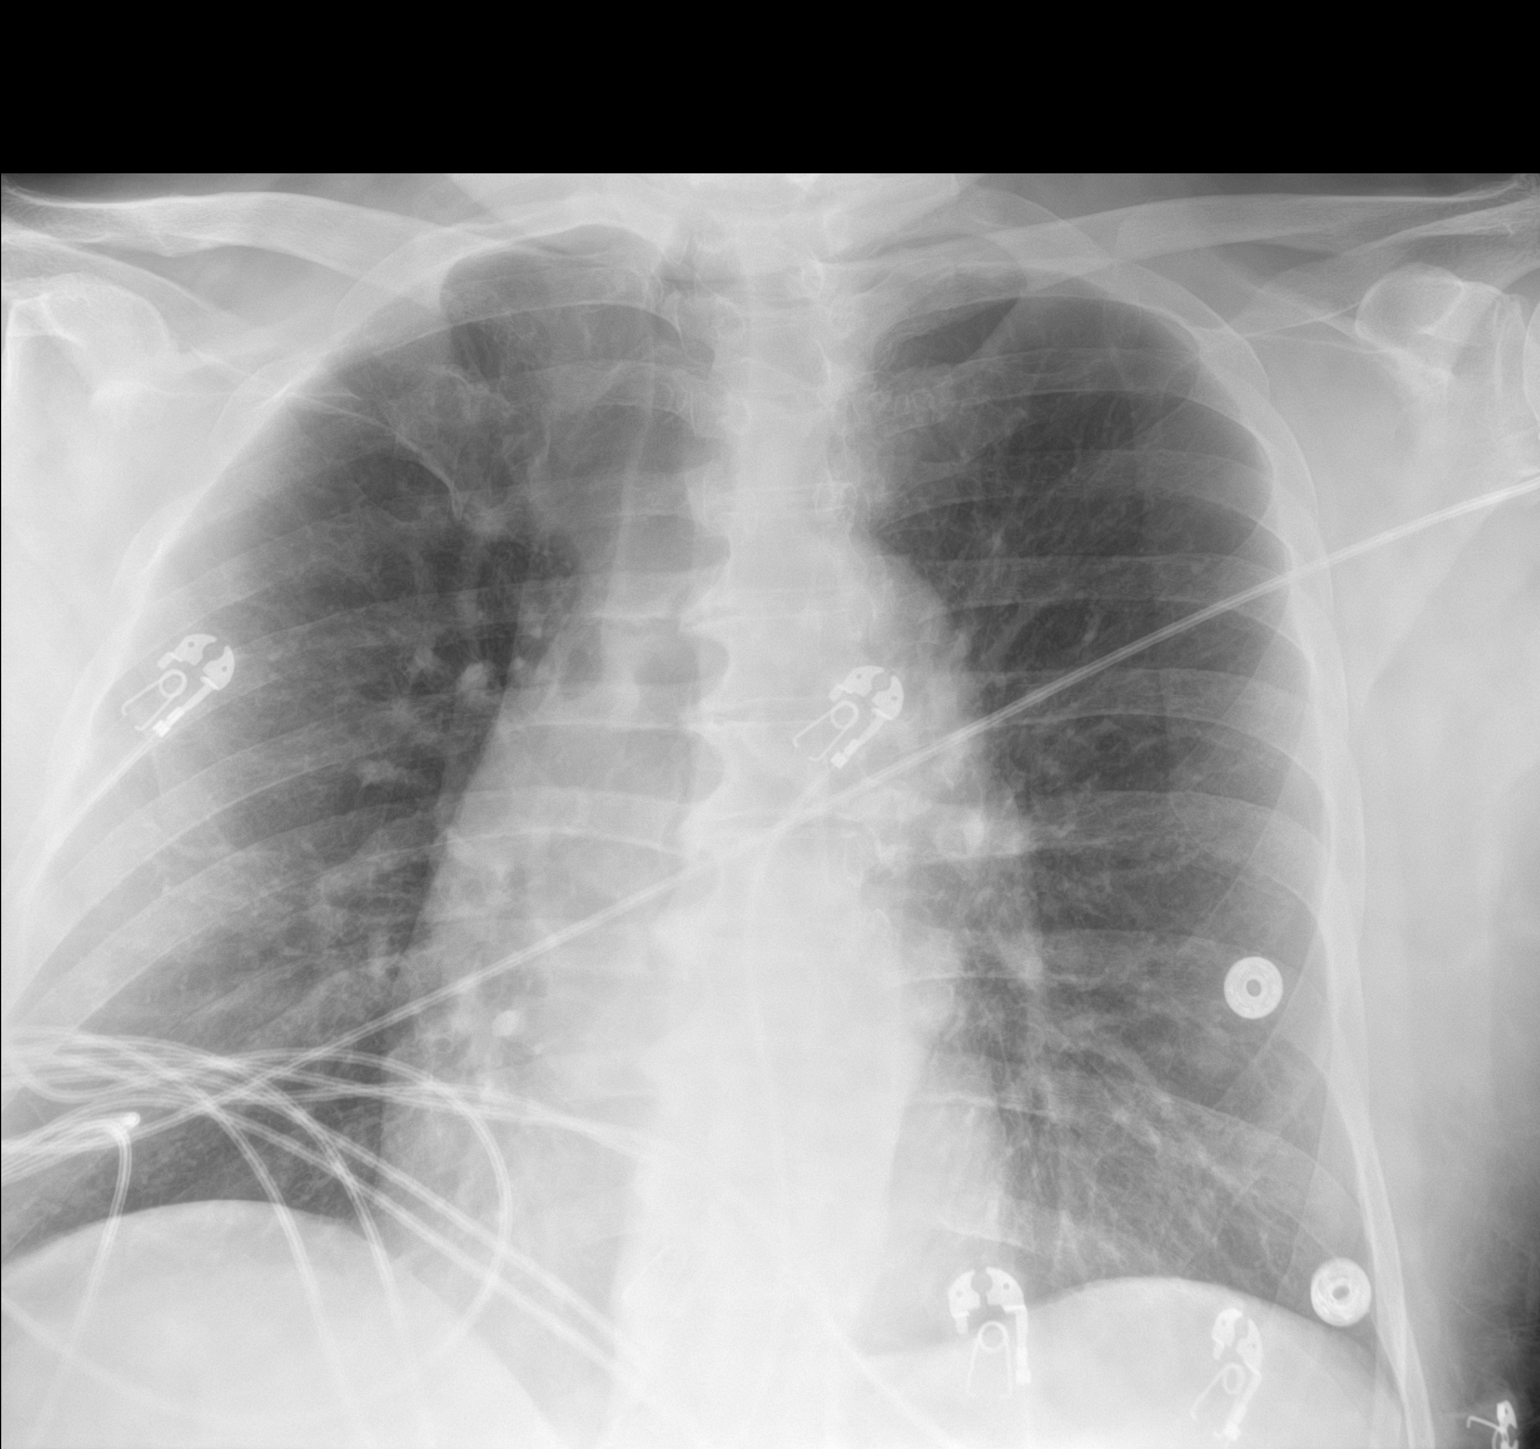

[chest ap (2 of 2)]
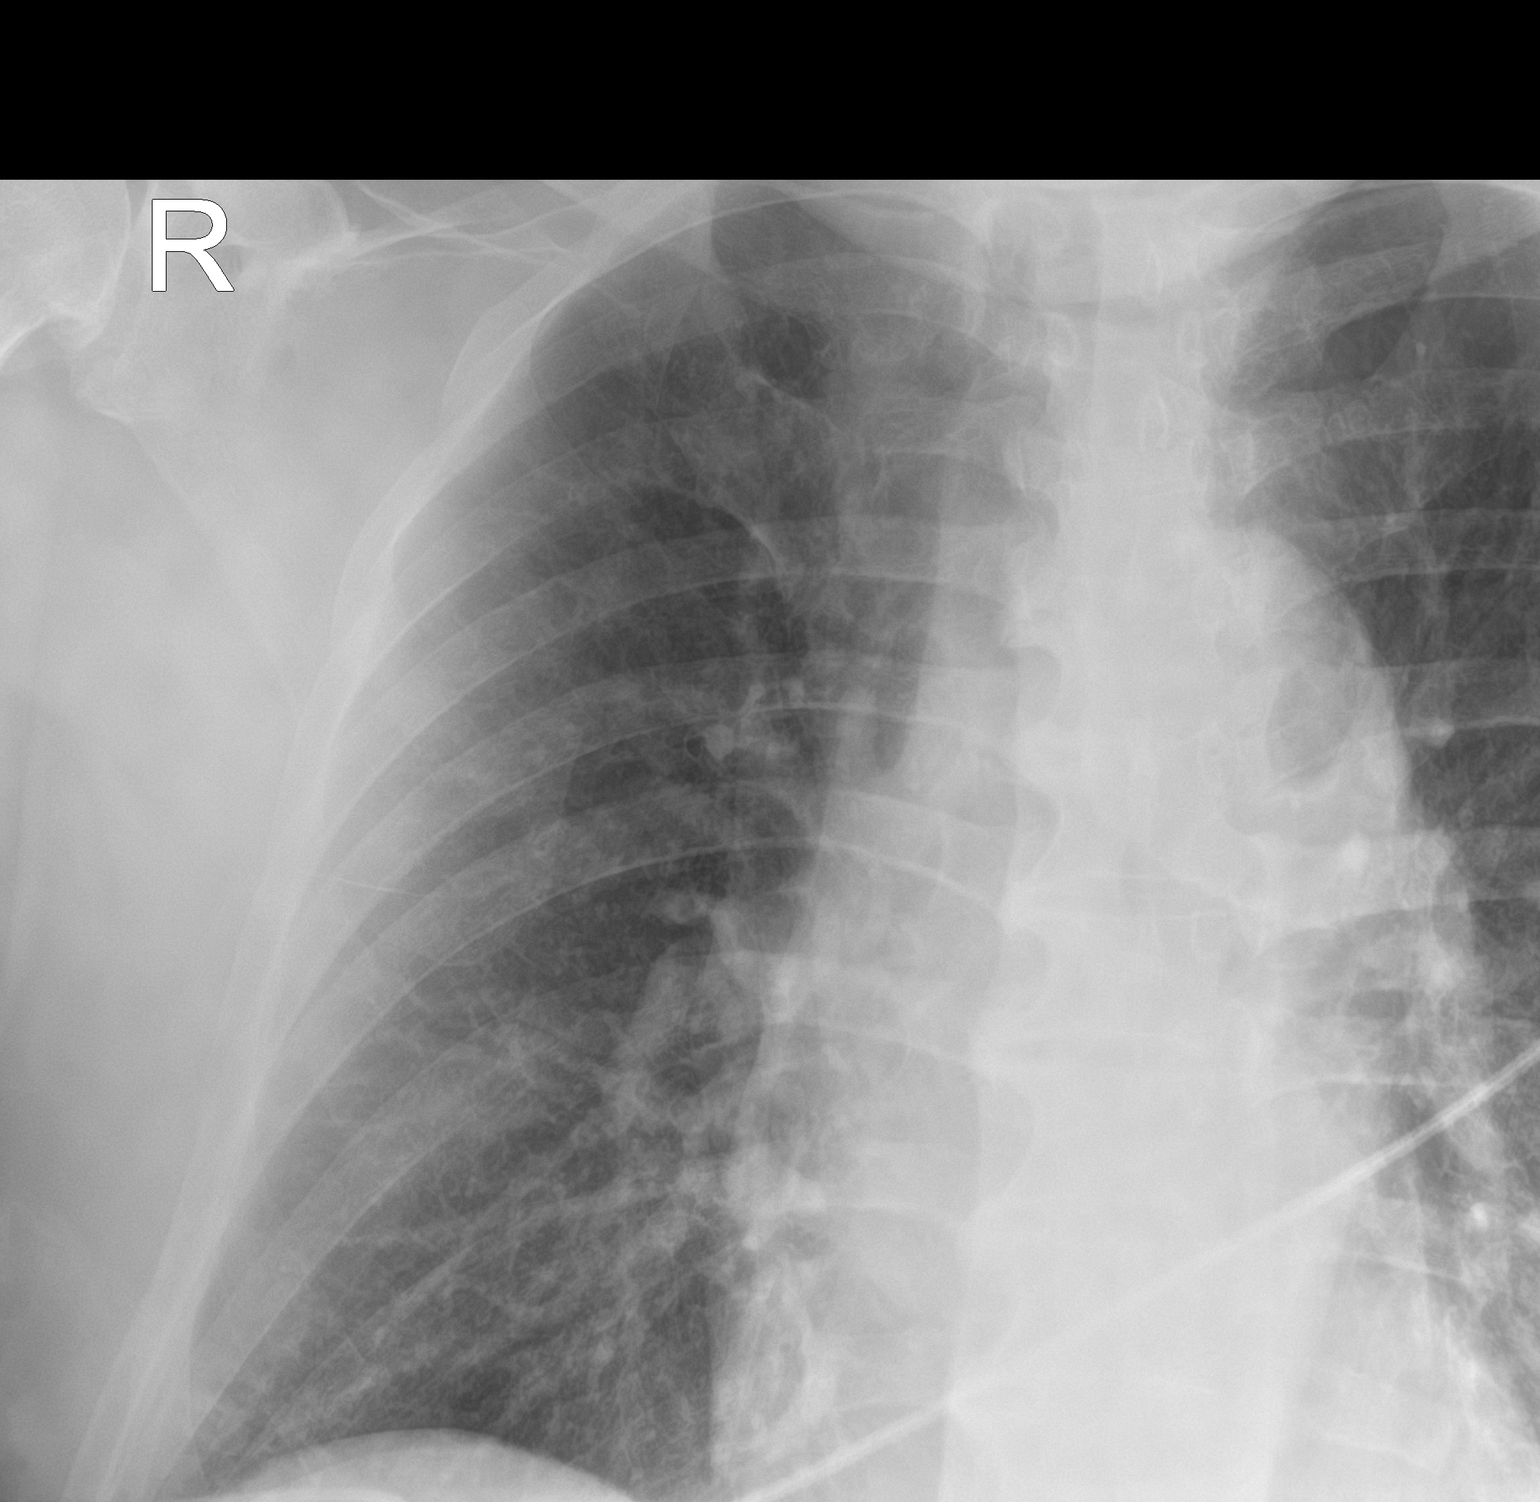

[2 of 2 positions shown; findings below may reference images not displayed]

FINDINGS: Note that a small portion of the inferolateral right base is not
visualized. Visualized lungs are clear. Heart size is upper normal
with pulmonary vascular normal. No adenopathy. No pneumothorax.
There is degenerative change in the thoracic spine. No fracture
appreciable.
IMPRESSION: Small portion of the inferolateral right base not visualized.
Visualized lungs clear. Heart upper normal in size. No pneumothorax.

## 2023-05-06 DIAGNOSIS — Z9889 Other specified postprocedural states: Secondary | ICD-10-CM | POA: Diagnosis not present

## 2023-05-06 DIAGNOSIS — G8929 Other chronic pain: Secondary | ICD-10-CM | POA: Diagnosis not present

## 2023-05-06 DIAGNOSIS — M25571 Pain in right ankle and joints of right foot: Secondary | ICD-10-CM | POA: Diagnosis not present

## 2023-05-06 DIAGNOSIS — T07XXXA Unspecified multiple injuries, initial encounter: Secondary | ICD-10-CM | POA: Diagnosis not present

## 2023-05-12 DIAGNOSIS — U071 COVID-19: Secondary | ICD-10-CM | POA: Diagnosis not present

## 2023-05-12 DIAGNOSIS — Z20822 Contact with and (suspected) exposure to covid-19: Secondary | ICD-10-CM | POA: Diagnosis not present

## 2023-05-12 DIAGNOSIS — R509 Fever, unspecified: Secondary | ICD-10-CM | POA: Diagnosis not present

## 2023-05-30 DIAGNOSIS — N138 Other obstructive and reflux uropathy: Secondary | ICD-10-CM | POA: Diagnosis not present

## 2023-06-24 DIAGNOSIS — Z0000 Encounter for general adult medical examination without abnormal findings: Secondary | ICD-10-CM | POA: Diagnosis not present

## 2023-06-24 DIAGNOSIS — Z23 Encounter for immunization: Secondary | ICD-10-CM | POA: Diagnosis not present

## 2023-06-24 DIAGNOSIS — E7800 Pure hypercholesterolemia, unspecified: Secondary | ICD-10-CM | POA: Diagnosis not present

## 2023-06-24 DIAGNOSIS — I10 Essential (primary) hypertension: Secondary | ICD-10-CM | POA: Diagnosis not present

## 2023-06-24 DIAGNOSIS — G894 Chronic pain syndrome: Secondary | ICD-10-CM | POA: Diagnosis not present

## 2023-06-25 ENCOUNTER — Other Ambulatory Visit: Payer: Self-pay | Admitting: Family Medicine

## 2023-06-25 DIAGNOSIS — Z87891 Personal history of nicotine dependence: Secondary | ICD-10-CM

## 2023-06-25 DIAGNOSIS — Z136 Encounter for screening for cardiovascular disorders: Secondary | ICD-10-CM

## 2023-08-06 DIAGNOSIS — G894 Chronic pain syndrome: Secondary | ICD-10-CM | POA: Diagnosis not present

## 2023-08-06 DIAGNOSIS — Z9889 Other specified postprocedural states: Secondary | ICD-10-CM | POA: Diagnosis not present

## 2023-08-06 DIAGNOSIS — T07XXXA Unspecified multiple injuries, initial encounter: Secondary | ICD-10-CM | POA: Diagnosis not present

## 2023-08-06 DIAGNOSIS — M25571 Pain in right ankle and joints of right foot: Secondary | ICD-10-CM | POA: Diagnosis not present

## 2023-08-20 ENCOUNTER — Ambulatory Visit
Admission: RE | Admit: 2023-08-20 | Discharge: 2023-08-20 | Disposition: A | Discharge status: Home / Self Care | Payer: Medicare Other | Source: Ambulatory Visit | Attending: Family Medicine | Admitting: Family Medicine

## 2023-08-20 DIAGNOSIS — Z87891 Personal history of nicotine dependence: Secondary | ICD-10-CM

## 2023-08-20 DIAGNOSIS — I10 Essential (primary) hypertension: Secondary | ICD-10-CM | POA: Diagnosis not present

## 2023-08-20 DIAGNOSIS — Z136 Encounter for screening for cardiovascular disorders: Secondary | ICD-10-CM | POA: Diagnosis not present

## 2023-08-20 DIAGNOSIS — G8929 Other chronic pain: Secondary | ICD-10-CM | POA: Diagnosis not present

## 2023-08-20 DIAGNOSIS — E7800 Pure hypercholesterolemia, unspecified: Secondary | ICD-10-CM | POA: Diagnosis not present

## 2023-10-02 DIAGNOSIS — M25512 Pain in left shoulder: Secondary | ICD-10-CM | POA: Diagnosis not present

## 2023-10-02 DIAGNOSIS — M25571 Pain in right ankle and joints of right foot: Secondary | ICD-10-CM | POA: Diagnosis not present

## 2023-10-02 DIAGNOSIS — M25572 Pain in left ankle and joints of left foot: Secondary | ICD-10-CM | POA: Diagnosis not present

## 2023-10-02 DIAGNOSIS — Z9889 Other specified postprocedural states: Secondary | ICD-10-CM | POA: Diagnosis not present

## 2023-10-02 DIAGNOSIS — G894 Chronic pain syndrome: Secondary | ICD-10-CM | POA: Diagnosis not present

## 2023-10-08 DIAGNOSIS — G894 Chronic pain syndrome: Secondary | ICD-10-CM | POA: Diagnosis not present

## 2023-11-12 DIAGNOSIS — R030 Elevated blood-pressure reading, without diagnosis of hypertension: Secondary | ICD-10-CM | POA: Diagnosis not present

## 2023-11-12 DIAGNOSIS — G8929 Other chronic pain: Secondary | ICD-10-CM | POA: Diagnosis not present

## 2023-11-12 DIAGNOSIS — Z79899 Other long term (current) drug therapy: Secondary | ICD-10-CM | POA: Diagnosis not present

## 2023-11-16 DIAGNOSIS — Z79899 Other long term (current) drug therapy: Secondary | ICD-10-CM | POA: Diagnosis not present

## 2023-11-19 DIAGNOSIS — R030 Elevated blood-pressure reading, without diagnosis of hypertension: Secondary | ICD-10-CM | POA: Diagnosis not present

## 2023-11-19 DIAGNOSIS — I10 Essential (primary) hypertension: Secondary | ICD-10-CM | POA: Diagnosis not present

## 2023-11-19 DIAGNOSIS — Z79899 Other long term (current) drug therapy: Secondary | ICD-10-CM | POA: Diagnosis not present

## 2023-11-19 DIAGNOSIS — G8929 Other chronic pain: Secondary | ICD-10-CM | POA: Diagnosis not present

## 2023-11-25 DIAGNOSIS — Z79899 Other long term (current) drug therapy: Secondary | ICD-10-CM | POA: Diagnosis not present

## 2023-12-17 DIAGNOSIS — R030 Elevated blood-pressure reading, without diagnosis of hypertension: Secondary | ICD-10-CM | POA: Diagnosis not present

## 2023-12-17 DIAGNOSIS — G8929 Other chronic pain: Secondary | ICD-10-CM | POA: Diagnosis not present

## 2023-12-17 DIAGNOSIS — Z79899 Other long term (current) drug therapy: Secondary | ICD-10-CM | POA: Diagnosis not present

## 2023-12-20 DIAGNOSIS — Z79899 Other long term (current) drug therapy: Secondary | ICD-10-CM | POA: Diagnosis not present

## 2023-12-24 DIAGNOSIS — R7303 Prediabetes: Secondary | ICD-10-CM | POA: Diagnosis not present

## 2023-12-24 DIAGNOSIS — I10 Essential (primary) hypertension: Secondary | ICD-10-CM | POA: Diagnosis not present

## 2023-12-24 DIAGNOSIS — E7800 Pure hypercholesterolemia, unspecified: Secondary | ICD-10-CM | POA: Diagnosis not present

## 2023-12-31 DIAGNOSIS — R3912 Poor urinary stream: Secondary | ICD-10-CM | POA: Diagnosis not present

## 2023-12-31 DIAGNOSIS — R3915 Urgency of urination: Secondary | ICD-10-CM | POA: Diagnosis not present

## 2023-12-31 DIAGNOSIS — R3911 Hesitancy of micturition: Secondary | ICD-10-CM | POA: Diagnosis not present

## 2023-12-31 DIAGNOSIS — R8290 Unspecified abnormal findings in urine: Secondary | ICD-10-CM | POA: Diagnosis not present

## 2023-12-31 DIAGNOSIS — R350 Frequency of micturition: Secondary | ICD-10-CM | POA: Diagnosis not present

## 2023-12-31 DIAGNOSIS — N138 Other obstructive and reflux uropathy: Secondary | ICD-10-CM | POA: Diagnosis not present

## 2023-12-31 DIAGNOSIS — R39198 Other difficulties with micturition: Secondary | ICD-10-CM | POA: Diagnosis not present

## 2024-01-03 DIAGNOSIS — Z01818 Encounter for other preprocedural examination: Secondary | ICD-10-CM | POA: Diagnosis not present

## 2024-01-06 DIAGNOSIS — Z01810 Encounter for preprocedural cardiovascular examination: Secondary | ICD-10-CM | POA: Diagnosis not present

## 2024-01-07 DIAGNOSIS — N138 Other obstructive and reflux uropathy: Secondary | ICD-10-CM | POA: Diagnosis not present

## 2024-01-07 DIAGNOSIS — Z79899 Other long term (current) drug therapy: Secondary | ICD-10-CM | POA: Diagnosis not present

## 2024-01-07 DIAGNOSIS — G4733 Obstructive sleep apnea (adult) (pediatric): Secondary | ICD-10-CM | POA: Diagnosis not present

## 2024-01-07 DIAGNOSIS — Z7982 Long term (current) use of aspirin: Secondary | ICD-10-CM | POA: Diagnosis not present

## 2024-01-07 DIAGNOSIS — E785 Hyperlipidemia, unspecified: Secondary | ICD-10-CM | POA: Diagnosis not present

## 2024-01-07 DIAGNOSIS — I10 Essential (primary) hypertension: Secondary | ICD-10-CM | POA: Diagnosis not present

## 2024-01-07 DIAGNOSIS — K439 Ventral hernia without obstruction or gangrene: Secondary | ICD-10-CM | POA: Diagnosis not present

## 2024-01-07 DIAGNOSIS — K219 Gastro-esophageal reflux disease without esophagitis: Secondary | ICD-10-CM | POA: Diagnosis not present

## 2024-01-08 DIAGNOSIS — N138 Other obstructive and reflux uropathy: Secondary | ICD-10-CM | POA: Diagnosis not present

## 2024-01-08 DIAGNOSIS — I10 Essential (primary) hypertension: Secondary | ICD-10-CM | POA: Diagnosis not present

## 2024-01-08 DIAGNOSIS — Z79899 Other long term (current) drug therapy: Secondary | ICD-10-CM | POA: Diagnosis not present

## 2024-01-08 DIAGNOSIS — E785 Hyperlipidemia, unspecified: Secondary | ICD-10-CM | POA: Diagnosis not present

## 2024-01-08 DIAGNOSIS — K439 Ventral hernia without obstruction or gangrene: Secondary | ICD-10-CM | POA: Diagnosis not present

## 2024-01-08 DIAGNOSIS — G4733 Obstructive sleep apnea (adult) (pediatric): Secondary | ICD-10-CM | POA: Diagnosis not present

## 2024-01-08 DIAGNOSIS — K219 Gastro-esophageal reflux disease without esophagitis: Secondary | ICD-10-CM | POA: Diagnosis not present

## 2024-01-08 DIAGNOSIS — Z7982 Long term (current) use of aspirin: Secondary | ICD-10-CM | POA: Diagnosis not present

## 2024-01-16 DIAGNOSIS — G8929 Other chronic pain: Secondary | ICD-10-CM | POA: Diagnosis not present

## 2024-01-16 DIAGNOSIS — Z79899 Other long term (current) drug therapy: Secondary | ICD-10-CM | POA: Diagnosis not present

## 2024-01-16 DIAGNOSIS — R030 Elevated blood-pressure reading, without diagnosis of hypertension: Secondary | ICD-10-CM | POA: Diagnosis not present

## 2024-01-20 DIAGNOSIS — Z79899 Other long term (current) drug therapy: Secondary | ICD-10-CM | POA: Diagnosis not present

## 2024-02-17 DIAGNOSIS — R030 Elevated blood-pressure reading, without diagnosis of hypertension: Secondary | ICD-10-CM | POA: Diagnosis not present

## 2024-02-17 DIAGNOSIS — G8929 Other chronic pain: Secondary | ICD-10-CM | POA: Diagnosis not present

## 2024-02-17 DIAGNOSIS — Z79899 Other long term (current) drug therapy: Secondary | ICD-10-CM | POA: Diagnosis not present

## 2024-02-20 DIAGNOSIS — Z79899 Other long term (current) drug therapy: Secondary | ICD-10-CM | POA: Diagnosis not present

## 2024-02-22 DIAGNOSIS — I10 Essential (primary) hypertension: Secondary | ICD-10-CM | POA: Diagnosis not present

## 2024-02-22 DIAGNOSIS — E7800 Pure hypercholesterolemia, unspecified: Secondary | ICD-10-CM | POA: Diagnosis not present

## 2024-03-17 DIAGNOSIS — R942 Abnormal results of pulmonary function studies: Secondary | ICD-10-CM | POA: Diagnosis not present

## 2024-03-17 DIAGNOSIS — Z79899 Other long term (current) drug therapy: Secondary | ICD-10-CM | POA: Diagnosis not present

## 2024-03-17 DIAGNOSIS — G8929 Other chronic pain: Secondary | ICD-10-CM | POA: Diagnosis not present

## 2024-03-17 DIAGNOSIS — Z0000 Encounter for general adult medical examination without abnormal findings: Secondary | ICD-10-CM | POA: Diagnosis not present

## 2024-03-17 DIAGNOSIS — R0602 Shortness of breath: Secondary | ICD-10-CM | POA: Diagnosis not present

## 2024-03-23 DIAGNOSIS — Z136 Encounter for screening for cardiovascular disorders: Secondary | ICD-10-CM | POA: Diagnosis not present

## 2024-03-23 DIAGNOSIS — Z79899 Other long term (current) drug therapy: Secondary | ICD-10-CM | POA: Diagnosis not present

## 2024-03-23 DIAGNOSIS — R942 Abnormal results of pulmonary function studies: Secondary | ICD-10-CM | POA: Diagnosis not present

## 2024-04-16 DIAGNOSIS — Z79899 Other long term (current) drug therapy: Secondary | ICD-10-CM | POA: Diagnosis not present

## 2024-04-16 DIAGNOSIS — G8929 Other chronic pain: Secondary | ICD-10-CM | POA: Diagnosis not present

## 2024-04-23 DIAGNOSIS — E7800 Pure hypercholesterolemia, unspecified: Secondary | ICD-10-CM | POA: Diagnosis not present

## 2024-04-23 DIAGNOSIS — I10 Essential (primary) hypertension: Secondary | ICD-10-CM | POA: Diagnosis not present

## 2024-05-18 DIAGNOSIS — I10 Essential (primary) hypertension: Secondary | ICD-10-CM | POA: Diagnosis not present

## 2024-05-18 DIAGNOSIS — Z79899 Other long term (current) drug therapy: Secondary | ICD-10-CM | POA: Diagnosis not present

## 2024-05-18 DIAGNOSIS — G8929 Other chronic pain: Secondary | ICD-10-CM | POA: Diagnosis not present

## 2024-05-18 DIAGNOSIS — R030 Elevated blood-pressure reading, without diagnosis of hypertension: Secondary | ICD-10-CM | POA: Diagnosis not present

## 2024-05-20 DIAGNOSIS — Z79899 Other long term (current) drug therapy: Secondary | ICD-10-CM | POA: Diagnosis not present

## 2024-05-24 DIAGNOSIS — E7800 Pure hypercholesterolemia, unspecified: Secondary | ICD-10-CM | POA: Diagnosis not present

## 2024-05-24 DIAGNOSIS — I10 Essential (primary) hypertension: Secondary | ICD-10-CM | POA: Diagnosis not present

## 2024-06-16 DIAGNOSIS — G8929 Other chronic pain: Secondary | ICD-10-CM | POA: Diagnosis not present

## 2024-06-16 DIAGNOSIS — M129 Arthropathy, unspecified: Secondary | ICD-10-CM | POA: Diagnosis not present

## 2024-06-16 DIAGNOSIS — Z79899 Other long term (current) drug therapy: Secondary | ICD-10-CM | POA: Diagnosis not present

## 2024-07-15 ENCOUNTER — Other Ambulatory Visit (HOSPITAL_COMMUNITY): Payer: Self-pay | Admitting: Family Medicine

## 2024-07-15 DIAGNOSIS — I10 Essential (primary) hypertension: Secondary | ICD-10-CM | POA: Diagnosis not present

## 2024-07-15 DIAGNOSIS — G894 Chronic pain syndrome: Secondary | ICD-10-CM | POA: Diagnosis not present

## 2024-07-15 DIAGNOSIS — R7303 Prediabetes: Secondary | ICD-10-CM | POA: Diagnosis not present

## 2024-07-15 DIAGNOSIS — E7800 Pure hypercholesterolemia, unspecified: Secondary | ICD-10-CM

## 2024-07-15 DIAGNOSIS — Z23 Encounter for immunization: Secondary | ICD-10-CM | POA: Diagnosis not present

## 2024-07-15 DIAGNOSIS — R42 Dizziness and giddiness: Secondary | ICD-10-CM | POA: Diagnosis not present

## 2024-07-15 DIAGNOSIS — Z0000 Encounter for general adult medical examination without abnormal findings: Secondary | ICD-10-CM | POA: Diagnosis not present

## 2024-07-16 DIAGNOSIS — R030 Elevated blood-pressure reading, without diagnosis of hypertension: Secondary | ICD-10-CM | POA: Diagnosis not present

## 2024-07-16 DIAGNOSIS — R102 Pelvic and perineal pain unspecified side: Secondary | ICD-10-CM | POA: Diagnosis not present

## 2024-07-16 DIAGNOSIS — Z79899 Other long term (current) drug therapy: Secondary | ICD-10-CM | POA: Diagnosis not present

## 2024-07-16 DIAGNOSIS — G8929 Other chronic pain: Secondary | ICD-10-CM | POA: Diagnosis not present

## 2024-07-22 ENCOUNTER — Ambulatory Visit (HOSPITAL_COMMUNITY): Payer: Self-pay

## 2024-07-22 ENCOUNTER — Encounter (HOSPITAL_COMMUNITY): Payer: Self-pay

## 2024-07-30 ENCOUNTER — Ambulatory Visit (HOSPITAL_COMMUNITY)
Admission: RE | Admit: 2024-07-30 | Discharge: 2024-07-30 | Disposition: A | Discharge status: Home / Self Care | Payer: Self-pay | Source: Ambulatory Visit | Attending: Family Medicine | Admitting: Family Medicine

## 2024-07-30 DIAGNOSIS — E7800 Pure hypercholesterolemia, unspecified: Secondary | ICD-10-CM

## 2024-08-31 ENCOUNTER — Ambulatory Visit: Attending: Internal Medicine | Admitting: Internal Medicine

## 2024-08-31 VITALS — BP 109/72 | HR 80 | Ht 67.5 in | Wt 208.0 lb

## 2024-08-31 DIAGNOSIS — R931 Abnormal findings on diagnostic imaging of heart and coronary circulation: Secondary | ICD-10-CM

## 2024-08-31 DIAGNOSIS — I10 Essential (primary) hypertension: Secondary | ICD-10-CM | POA: Diagnosis not present

## 2024-08-31 DIAGNOSIS — E785 Hyperlipidemia, unspecified: Secondary | ICD-10-CM

## 2024-08-31 MED ORDER — ASPIRIN 81 MG PO TBEC: 81.0000 mg | DELAYED_RELEASE_TABLET | Freq: Every day | ORAL | 3 refills | Status: AC

## 2024-08-31 NOTE — Patient Instructions (Signed)
 Medication Instructions:  Your physician has recommended you make the following change in your medication:  START: Aspirin  81 mg by mouth once daily  *If you need a refill on your cardiac medications before your next appointment, please call your pharmacy*  Lab Work: At any lab Corp: Fasting lipid panel and Lipoprotein A (nothing to eat or drink 8-12 hours before blood is drawn)  If you have labs (blood work) drawn today and your tests are completely normal, you will receive your results only by: MyChart Message (if you have MyChart) OR A paper copy in the mail If you have any lab test that is abnormal or we need to change your treatment, we will call you to review the results.  Testing/Procedures: NONE   Follow-Up: At [facility], you and your health needs are our priority.  As part of our continuing mission to provide you with exceptional heart care, our providers are all part of one team.  This team includes your primary Cardiologist (physician) and Advanced Practice Providers or APPs (Physician Assistants and Nurse Practitioners) who all work together to provide you with the care you need, when you need it.  Your next appointment:   1 year(s)  Provider:   None or One of our Advanced Practice Providers (APPs): Morse Clause, PA-C  Lamarr Satterfield, NP Miriam Shams, NP  Olivia Pavy, PA-C Josefa Beauvais, NP  Leontine Salen, PA-C Orren Fabry, PA-C  Hao Meng, PA-C Ernest Dick, NP  Damien Braver, NP Jon Hails, PA-C  Waddell Donath, PA-C    Dayna Dunn, PA-C  Scott Weaver, PA-C Lum Louis, NP Katlyn West, NP Callie Goodrich, PA-C  Xika Zhao, NP Sheng Haley, PA-C    Kathleen Johnson, PA-C

## 2024-08-31 NOTE — Progress Notes (Signed)
 Cardiology Office Note:  .    Date:  08/31/2024  ID:  Theodore Gonzalez, DOB 1956/06/04, MRN [id number] PCP: Sun, Vyvyan, MD  [facility] Providers Cardiologist:  None     CC: CAC score discussion Consulted for the evaluation of CAC at the behest of Dr. Austin   History of Present Illness: .    Theodore Gonzalez is a 68 y.o. male with coronary artery calcifications and aortic atherosclerosis who presents for evaluation of coronary artery calcifications.  He has a history of coronary artery calcifications and aortic atherosclerosis, discovered incidentally on imaging. He is asymptomatic, with no chest pain, pressure, tightness, stinging, or shortness of breath during physical activities such as walking two to three miles or painting his house. He typically walks two to three miles two days a week, although he has not been to the gym much lately.  His family history is significant for heart problems, with a sister who passed away from a heart condition and another sister who had a blockage treated with a stent. He quit smoking 25 years ago and has a history of elevated cholesterol, which is slightly above goal.  He follows a diet low in fried foods, primarily consuming chicken, fish, and occasionally steak. He has lost 12 pounds this year and has a history of fatty liver. He drinks vegetable juices made from celery, carrots, and beets.  He has a history of prior granulomatous disease, noted on imaging, but is not aware of any specific symptoms related to this. He moved from [place] to [place] 35 years ago and is planning a trip to [place] soon. He is concerned about the risk of a heart attack while traveling.  Discussed the use of AI scribe software for clinical note transcription with the patient, who gave verbal consent to proceed.   Relevant histories: .  Social  - from [place], been here 35 years, smoke free 24 years, FHX of CAC (sisters) ROS: As per HPI.   Studies Reviewed: .      Cardiac Studies & Procedures   ______________________________________________________________________________________________          CT SCANS  CT CARDIAC SCORING (SELF PAY ONLY) 07/30/2024  Addendum 08/05/2024  9:53 PM ADDENDUM REPORT: 08/05/2024 21:51  EXAM: OVER-READ INTERPRETATION  CT CHEST  The following report is an over-read performed by radiologist Dr. Oneil Devonshire of [facility] on 08/05/2024. This over-read does not include interpretation of cardiac or coronary anatomy or pathology. The coronary calcium  score interpretation by the cardiologist is attached.  COMPARISON:  None.  FINDINGS: Cardiovascular: There are no significant extracardiac vascular findings.  Mediastinum/Nodes: There are no enlarged lymph nodes within the visualized mediastinum.  Lungs/Pleura: There is no pleural effusion. Few calcified granulomas are identified.  Upper abdomen: No significant findings in the visualized upper abdomen.  Musculoskeletal/Chest wall: No chest wall mass or suspicious osseous findings within the visualized chest.  IMPRESSION: Changes of prior granulomatous disease. No acute abnormality is seen.   Electronically Signed By: Oneil Devonshire M.D. On: 08/05/2024 21:51  Narrative CLINICAL DATA:  Cardiovascular Disease Risk stratification  EXAM: Coronary Calcium  Score  TECHNIQUE: A gated, non-contrast computed tomography scan of the heart was performed using 3mm slice thickness. Axial images were analyzed on a dedicated workstation. Calcium  scoring of the coronary arteries was performed using the Agatston method.  FINDINGS: Coronary arteries: Normal origins.  Coronary Calcium  Score:  Left main: 43  Left anterior descending artery: 875  Left circumflex artery: 65  Right  coronary artery: 360  Total: 1342  Percentile: 91  Pericardium: Normal.  Ascending Aorta: Normal caliber.  Non-cardiac: See separate report from [facility].  IMPRESSION: Coronary calcium  score of 1342. This was 70 percentile for age-, race-, and sex-matched controls.  RECOMMENDATIONS: Coronary artery calcium  (CAC) score is a strong predictor of incident coronary heart disease (CHD) and provides predictive information beyond traditional risk factors. CAC scoring is reasonable to use in the decision to withhold, postpone, or initiate statin therapy in intermediate-risk or selected borderline-risk asymptomatic adults (age 27-75 years and LDL-C >=70 to <190 mg/dL) who do not have diabetes or established atherosclerotic cardiovascular disease (ASCVD).* In intermediate-risk (10-year ASCVD risk >=7.5% to <20%) adults or selected borderline-risk (10-year ASCVD risk >=5% to <7.5%) adults in whom a CAC score is measured for the purpose of making a treatment decision the following recommendations have been made:  If CAC=0, it is reasonable to withhold statin therapy and reassess in 5 to 10 years, as long as higher risk conditions are absent (diabetes mellitus, family history of premature CHD in first degree relatives (males <55 years; females <65 years), cigarette smoking, or LDL >=190 mg/dL).  If CAC is 1 to 99, it is reasonable to initiate statin therapy for patients >=70 years of age.  If CAC is >=100 or >=75th percentile, it is reasonable to initiate statin therapy at any age.  Cardiology referral should be considered for patients with CAC scores >=400 or >=75th percentile.  *2018 AHA/ACC/AACVPR/AAPA/ABC/ACPM/ADA/AGS/APhA/ASPC/NLA/PCNA Guideline on the Management of Blood Cholesterol: A Report of the American College of Cardiology/American Heart Association Task Force on Clinical Practice Guidelines. J Am Coll Cardiol. 2019;73(24):3168-3209.  Electronically Signed: By: Oneil Parchment M.D. On: 07/30/2024 14:09     ______________________________________________________________________________________________        Physical Exam:    VS:  BP 109/72   Pulse 80   Ht 5' 7.5 (1.715 m)   Wt 208 lb (94.3 kg)   SpO2 94%   BMI 32.10 kg/m    Wt Readings from Last 3 Encounters:  08/31/24 208 lb (94.3 kg)  07/20/21 202 lb 3.2 oz (91.7 kg)  03/09/21 200 lb 12.8 oz (91.1 kg)    Gen: no distress   Neck: No JVD, no carotid bruit Cardiac: No Rubs or Gallops, no Murmur, RRR +2 radial pulses Respiratory: Clear to auscultation bilaterally, normal effort, normal  respiratory rate GI: Soft, nontender, non-distended  MS: No  edema;  moves all extremities Integument: Skin feels warm Neuro:  At time of evaluation, alert and oriented to person/place/time/situation  Psych: Normal affect, patient feels ok     ASSESSMENT AND PLAN: .    An EKG was ordered for CAD and shows NSR   Atherosclerotic coronary artery disease Elevated coronary artery calcium  score indicating significant calcification in coronary arteries. No symptoms of obstructive coronary artery disease such as chest pain, pressure, or tightness during physical activity. Risk of myocardial infarction acknowledged but not imminent. Emphasis on prevention and lifestyle modifications. - Started aspirin  81 mg PO daily - Encouraged increased physical activity (weight lifting and treadmill) - Will recheck cholesterol levels in late January with LPA - Will consider increasing rosuvastatin to 80 mg if cholesterol remains high - Discussed dietary modifications to reduce cholesterol - Will consider gated cardiac CT if symptoms develop  Aortic atherosclerosis -Noted on imaging. No acute symptoms or interventions required at this time.  Mitral valve calcification - Small amount of calcification on the posterior portion of the mitral valve. No significant  heart murmur or symptoms related to mitral valve calcification.  Hypercholesterolemia Cholesterol levels slightly above goal, at the upper limits of normal. Emphasis on aggressive cholesterol management to  reduce cardiovascular risk. - Will recheck cholesterol levels in late January - Will consider increasing rosuvastatin to 80 mg if cholesterol remains high - Discussed dietary modifications to reduce cholesterol  One year me or my team unless new sx  Stanly Leavens, MD FASE [facility] Cardiologist [facility]  [address], [address] [place], [place] [address] [phone]  2:31 PM
